# Patient Record
Sex: Female | Born: 1995 | Race: Black or African American | Hispanic: No | Marital: Single | Smoking: Current every day smoker
Health system: Southern US, Community
[De-identification: ages and names within clinical notes are randomized; demographics above are authoritative.]

---
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
## 2017-03-12 ENCOUNTER — Other Ambulatory Visit: Payer: Self-pay

## 2017-03-12 ENCOUNTER — Encounter (HOSPITAL_COMMUNITY): Payer: Self-pay | Admitting: Emergency Medicine

## 2017-03-12 DIAGNOSIS — Z5321 Procedure and treatment not carried out due to patient leaving prior to being seen by health care provider: Secondary | ICD-10-CM | POA: Insufficient documentation

## 2017-03-12 NOTE — ED Triage Notes (Signed)
Patient reports low back pain onset yesterday , denies injury , pt. stated pain occurs when she is on her period , pt. added heavy lifting at work . No dysuria or hematuria .

## 2017-03-13 ENCOUNTER — Emergency Department (HOSPITAL_COMMUNITY)
Admission: EM | Admit: 2017-03-13 | Discharge: 2017-03-13 | Disposition: A | Discharge status: Home / Self Care | Payer: Self-pay | Attending: Emergency Medicine | Admitting: Emergency Medicine

## 2017-03-13 NOTE — ED Notes (Signed)
Pt LWBS 

## 2017-07-30 ENCOUNTER — Encounter (HOSPITAL_COMMUNITY): Payer: Self-pay | Admitting: Emergency Medicine

## 2017-07-30 ENCOUNTER — Other Ambulatory Visit: Payer: Self-pay

## 2017-07-30 ENCOUNTER — Emergency Department (HOSPITAL_COMMUNITY): Payer: Self-pay

## 2017-07-30 ENCOUNTER — Emergency Department (HOSPITAL_COMMUNITY)
Admission: EM | Admit: 2017-07-30 | Discharge: 2017-07-30 | Disposition: A | Discharge status: Home / Self Care | Payer: Self-pay | Attending: Emergency Medicine | Admitting: Emergency Medicine

## 2017-07-30 DIAGNOSIS — Z7982 Long term (current) use of aspirin: Secondary | ICD-10-CM | POA: Insufficient documentation

## 2017-07-30 DIAGNOSIS — S61411A Laceration without foreign body of right hand, initial encounter: Secondary | ICD-10-CM | POA: Insufficient documentation

## 2017-07-30 DIAGNOSIS — W260XXA Contact with knife, initial encounter: Secondary | ICD-10-CM | POA: Insufficient documentation

## 2017-07-30 DIAGNOSIS — Y93G3 Activity, cooking and baking: Secondary | ICD-10-CM | POA: Insufficient documentation

## 2017-07-30 DIAGNOSIS — Y999 Unspecified external cause status: Secondary | ICD-10-CM | POA: Insufficient documentation

## 2017-07-30 DIAGNOSIS — F17210 Nicotine dependence, cigarettes, uncomplicated: Secondary | ICD-10-CM | POA: Insufficient documentation

## 2017-07-30 DIAGNOSIS — Y929 Unspecified place or not applicable: Secondary | ICD-10-CM | POA: Insufficient documentation

## 2017-07-30 DIAGNOSIS — Z79899 Other long term (current) drug therapy: Secondary | ICD-10-CM | POA: Insufficient documentation

## 2017-07-30 MED ORDER — LIDOCAINE-EPINEPHRINE (PF) 2 %-1:200000 IJ SOLN
10.0000 mL | Freq: Once | INTRAMUSCULAR | Status: AC
  Administered 2017-07-30: 10 mL via INTRADERMAL
  Filled 2017-07-30: qty 20

## 2017-07-30 MED ORDER — TETANUS-DIPHTH-ACELL PERTUSSIS 5-2.5-18.5 LF-MCG/0.5 IM SUSP
0.5000 mL | Freq: Once | INTRAMUSCULAR | Status: AC
  Administered 2017-07-30: 0.5 mL via INTRAMUSCULAR
  Filled 2017-07-30: qty 0.5

## 2017-07-30 MED ORDER — IBUPROFEN 800 MG PO TABS: 800.0000 mg | ORAL_TABLET | Freq: Three times a day (TID) | ORAL | 0 refills | Status: DC | PRN

## 2017-07-30 NOTE — Discharge Instructions (Addendum)
Please have your sutures remove in 5 days.  Take ibuprofen as needed for pain.  Monitor for sign of infection.

## 2017-07-30 NOTE — ED Notes (Signed)
ED Provider at bedside. 

## 2017-07-30 NOTE — ED Notes (Signed)
Pt states right hand was cut on knife that she also used for raw chicken.

## 2017-07-30 NOTE — ED Triage Notes (Signed)
C/o approx 1-2 cm laceration to posterior R hand  (near 4th and 5th digits) from a kitchen knife while cooking 30 min ago.  Bleeding controlled pta.
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
## 2017-07-30 NOTE — ED Provider Notes (Signed)
[facility] EMERGENCY DEPARTMENT Provider Note   CSN: [id number] Arrival date & time: 07/30/17  2056     History   Chief Complaint Chief Complaint  Patient presents with  . Extremity Laceration    HPI Tiffany Stephens is a 22 y.o. female.  HPI   22 year old female presenting for evaluation of right hand injury.  Patient reports a few hours ago she was cooking, while cutting raw chicken she accidentally stabbed her right dominant hand with a knife.  She report acute onset of sharp pain to the affected area.  Pain is rated as 6 out of 10, nonradiating without any associated numbness.  No specific treatment tried.  She is here for management.  She cannot recall last tetanus status.  History reviewed. No pertinent past medical history.  Patient Active Problem List   Diagnosis Date Noted  . Menorrhagia 07/29/2012    History reviewed. No pertinent surgical history.   OB History   None      Home Medications    Prior to Admission medications   Medication Sig Start Date End Date Taking? Authorizing Provider  aspirin 325 MG tablet Take 325 mg by mouth every 4 (four) hours as needed for pain.    [provider]  ibuprofen (ADVIL,MOTRIN) 800 MG tablet Take 1 tablet (800 mg total) by mouth 3 (three) times daily. 11/03/12   Garlon Hatchet, PA-C    Family History Family History  Problem Relation Age of Onset  . Diabetes Mother   . Heart disease Mother     Social History Social History   Tobacco Use  . Smoking status: Current Every Day Smoker  . Smokeless tobacco: Never Used  Substance Use Topics  . Alcohol use: Yes  . Drug use: No     Allergies   Patient has no known allergies.   Review of Systems Review of Systems  Skin: Positive for wound.  Neurological: Negative for numbness.     Physical Exam Updated Vital Signs BP 138/84 (BP Location: Right Arm)   Pulse 83   Temp 98 F (36.7 C) (Oral)   Resp 16   LMP 07/25/2017   SpO2  100%   Physical Exam  Constitutional: She appears well-developed and well-nourished. No distress.  HENT:  Head: Atraumatic.  Eyes: Conjunctivae are normal.  Neck: Neck supple.  Musculoskeletal: She exhibits tenderness (Right hand: There is a puncture wound approximately 0.5 cm in diameter noted to the dorsum of the right hand in between fourth and fifth metacarpal region without any foreign body noted.  It is tender to palpation with surrounding edema but no ecchymosis. ).  Neurological: She is alert.  Skin: No rash noted.  Psychiatric: She has a normal mood and affect.  Nursing note and vitals reviewed.    ED Treatments / Results  Labs (all labs ordered are listed, but only abnormal results are displayed) Labs Reviewed - No data to display  EKG None  Radiology Dg Hand Complete Right  Result Date: 07/30/2017 CLINICAL DATA:  Laceration to the right posterior hand between the fourth and fifth metacarpal phalangeal joints. EXAM: RIGHT HAND - COMPLETE 3+ VIEW COMPARISON:  None. FINDINGS: Soft tissue swelling over the dorsum of the hand at the level of the MCP joints. Reported laceration is radiographically occult. No radiopaque foreign body nor acute osseous abnormality. Joint spaces are maintained. Carpal rows are intact. Distal radius and ulna are nonacute. IMPRESSION: Soft tissue swelling over the dorsum of the hand. No  radiopaque foreign body nor acute osseous abnormality. Electronically Signed   By: Tollie Eth M.D.   On: 07/30/2017 22:34    Procedures .Marland KitchenLaceration Repair Date/Time: 07/30/2017 11:18 PM Performed by: Fayrene Helper, PA-C Authorized by: Fayrene Helper, PA-C   Consent:    Consent obtained:  Verbal   Consent given by:  Patient   Risks discussed:  Infection   Alternatives discussed:  No treatment Anesthesia (see MAR for exact dosages):    Anesthesia method:  Local infiltration   Local anesthetic:  Lidocaine 2% WITH epi Laceration details:    Location:  Hand   Hand  location:  R hand, dorsum   Length (cm):  1   Depth (mm):  5 Repair type:    Repair type:  Simple Pre-procedure details:    Preparation:  Patient was prepped and draped in usual sterile fashion and imaging obtained to evaluate for foreign bodies Exploration:    Hemostasis achieved with:  Direct pressure   Wound exploration: wound explored through full range of motion and entire depth of wound probed and visualized     Wound extent: no muscle damage noted, no nerve damage noted and no underlying fracture noted     Contaminated: no   Treatment:    Area cleansed with:  Saline   Amount of cleaning:  Standard   Irrigation solution:  Sterile saline   Irrigation volume:  100   Irrigation method:  Pressure wash   Visualized foreign bodies/material removed: no   Skin repair:    Repair method:  Sutures   Suture size:  5-0   Suture material:  Prolene   Suture technique:  Simple interrupted   Number of sutures:  3 Approximation:    Approximation:  Close Post-procedure details:    Dressing:  Non-adherent dressing   Patient tolerance of procedure:  Tolerated well, no immediate complications   (including critical care time)  Medications Ordered in ED Medications  lidocaine-EPINEPHrine (XYLOCAINE W/EPI) 2 %-1:200000 (PF) injection 10 mL (10 mLs Intradermal Given by Other 07/30/17 2257)  Tdap (BOOSTRIX) injection 0.5 mL (0.5 mLs Intramuscular Given 07/30/17 2257)     Initial Impression / Assessment and Plan / ED Course  I have reviewed the triage vital signs and the nursing notes.  Pertinent labs & imaging results that were available during my care of the patient were reviewed by me and considered in my medical decision making (see chart for details).     BP 138/84 (BP Location: Right Arm)   Pulse 83   Temp 98 F (36.7 C) (Oral)   Resp 16   LMP 07/25/2017   SpO2 100%    Final Clinical Impressions(s) / ED Diagnoses   Final diagnoses:  Laceration of right hand without foreign  body, initial encounter    ED Discharge Orders        Ordered    ibuprofen (ADVIL,MOTRIN) 800 MG tablet  Every 8 hours PRN     07/30/17 2322     10:53 PM Patient accepting staff is soft in her right dominant hand.  She has a puncture wound noted to the dorsum of her right hand.  It is not actively bleeding.  We will update her tetanus, will perform a thorough wound irrigation and suture repair.  An x-ray was obtained demonstrating soft tissue swelling over the dorsum of the hand without any radiopaque foreign body or acute osseous abnormality.  11:21 PM Wound were cleansed.  Sutures in place.  Recommend sutures removal in 5 days.  Wound care instruction given, return precaution discussed.    Fayrene Helper, PA-C 07/30/17 2324    Pricilla Loveless, MD 07/30/17 [phone]

## 2017-10-07 ENCOUNTER — Encounter (HOSPITAL_COMMUNITY): Payer: Self-pay

## 2017-10-07 ENCOUNTER — Emergency Department (HOSPITAL_COMMUNITY)
Admission: EM | Admit: 2017-10-07 | Discharge: 2017-10-07 | Disposition: A | Discharge status: Home / Self Care | Payer: Self-pay | Attending: Emergency Medicine | Admitting: Emergency Medicine

## 2017-10-07 DIAGNOSIS — F17200 Nicotine dependence, unspecified, uncomplicated: Secondary | ICD-10-CM | POA: Insufficient documentation

## 2017-10-07 DIAGNOSIS — J029 Acute pharyngitis, unspecified: Secondary | ICD-10-CM | POA: Insufficient documentation

## 2017-10-07 DIAGNOSIS — Z7982 Long term (current) use of aspirin: Secondary | ICD-10-CM | POA: Insufficient documentation

## 2017-10-07 DIAGNOSIS — Z202 Contact with and (suspected) exposure to infections with a predominantly sexual mode of transmission: Secondary | ICD-10-CM | POA: Insufficient documentation

## 2017-10-07 LAB — GROUP A STREP BY PCR: Group A Strep by PCR: NOT DETECTED

## 2017-10-07 MED ORDER — STERILE WATER FOR INJECTION IJ SOLN
INTRAMUSCULAR | Status: AC
  Administered 2017-10-07: 10 mL
  Filled 2017-10-07: qty 10

## 2017-10-07 MED ORDER — IBUPROFEN 800 MG PO TABS: 800.0000 mg | ORAL_TABLET | Freq: Three times a day (TID) | ORAL | 0 refills | Status: DC | PRN

## 2017-10-07 MED ORDER — IBUPROFEN 800 MG PO TABS
800.0000 mg | ORAL_TABLET | Freq: Once | ORAL | Status: AC
  Administered 2017-10-07: 800 mg via ORAL
  Filled 2017-10-07: qty 1

## 2017-10-07 MED ORDER — CEFTRIAXONE SODIUM 250 MG IJ SOLR
250.0000 mg | Freq: Once | INTRAMUSCULAR | Status: AC
  Administered 2017-10-07: 250 mg via INTRAMUSCULAR
  Filled 2017-10-07: qty 250

## 2017-10-07 MED ORDER — AZITHROMYCIN 250 MG PO TABS
1000.0000 mg | ORAL_TABLET | Freq: Once | ORAL | Status: AC
  Administered 2017-10-07: 1000 mg via ORAL
  Filled 2017-10-07: qty 4

## 2017-10-07 NOTE — Discharge Instructions (Addendum)
You were seen in the ER for a sore throat. Your strep test was negative. We discussed that it is possible that an STD caused this, but we are unsure at this time, your tests for gonorrhea/chlamydia are pending, we will call you if these results are positive, if positive you will need to informal all sexual partners. We have treated you for these conditions in the ER . We are also prescribing you 800 mg ibuprofen, take this every 8 hours with foods as needed for pain. This can cause stomach upset and at worst stomach bleeding. Do not take other NSAIDs with this such as advil, motrin, naproxen, mobic, or aleve as these are similar. You may supplement with tylenol per over the counter dosing.    Follow up with your primary care provider within 5 days for re-evaluation. Return to the ER for new or worsening symptoms including but not limited to fever, inability to swallow/move your neck/open your mouth, or change in your voice, or any other concerns.

## 2017-10-07 NOTE — ED Notes (Signed)
Declined W/C at D/C and was escorted to lobby by RN. 
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
## 2017-10-07 NOTE — ED Triage Notes (Signed)
Pt presents with 2 day h/o difficulty swallowing.  Pt denies any cold symptoms, denies any fever.

## 2017-10-08 LAB — GC/CHLAMYDIA PROBE AMP (~~LOC~~) NOT AT ARMC
Chlamydia: NEGATIVE
Neisseria Gonorrhea: NEGATIVE

## 2018-02-04 ENCOUNTER — Emergency Department (HOSPITAL_COMMUNITY)
Admission: EM | Admit: 2018-02-04 | Discharge: 2018-02-04 | Disposition: A | Discharge status: Home / Self Care | Payer: Self-pay | Attending: Emergency Medicine | Admitting: Emergency Medicine

## 2018-02-04 ENCOUNTER — Encounter (HOSPITAL_COMMUNITY): Payer: Self-pay | Admitting: Emergency Medicine

## 2018-02-04 ENCOUNTER — Other Ambulatory Visit: Payer: Self-pay

## 2018-02-04 DIAGNOSIS — Z7982 Long term (current) use of aspirin: Secondary | ICD-10-CM | POA: Insufficient documentation

## 2018-02-04 DIAGNOSIS — K0889 Other specified disorders of teeth and supporting structures: Secondary | ICD-10-CM | POA: Insufficient documentation

## 2018-02-04 DIAGNOSIS — K029 Dental caries, unspecified: Secondary | ICD-10-CM | POA: Insufficient documentation

## 2018-02-04 DIAGNOSIS — F17210 Nicotine dependence, cigarettes, uncomplicated: Secondary | ICD-10-CM | POA: Insufficient documentation

## 2018-02-04 MED ORDER — OXYCODONE-ACETAMINOPHEN 5-325 MG PO TABS
2.0000 | ORAL_TABLET | Freq: Once | ORAL | Status: AC
  Administered 2018-02-04: 2 via ORAL
  Filled 2018-02-04: qty 2

## 2018-02-04 MED ORDER — BUPIVACAINE-EPINEPHRINE (PF) 0.25% -1:200000 IJ SOLN: 1.7000 mL | Freq: Once | INTRAMUSCULAR | Status: DC

## 2018-02-04 MED ORDER — AMOXICILLIN 500 MG PO CAPS: 1000.0000 mg | ORAL_CAPSULE | Freq: Two times a day (BID) | ORAL | 0 refills | Status: AC

## 2018-02-04 MED ORDER — IBUPROFEN 600 MG PO TABS: 600.0000 mg | ORAL_TABLET | Freq: Three times a day (TID) | ORAL | 0 refills | Status: DC | PRN

## 2018-02-04 MED ORDER — BUPIVACAINE-EPINEPHRINE (PF) 0.5% -1:200000 IJ SOLN
1.8000 mL | Freq: Once | INTRAMUSCULAR | Status: AC
  Administered 2018-02-04: 1.8 mL

## 2018-02-04 MED ORDER — HYDROCODONE-ACETAMINOPHEN 5-325 MG PO TABS: 1.0000 | ORAL_TABLET | Freq: Four times a day (QID) | ORAL | 0 refills | Status: DC | PRN

## 2018-02-04 NOTE — ED Triage Notes (Signed)
Pt reports a swollen left lower & upper jaw with a headache and ear pain. Pt states this all started yesterday, 11/21. Pt does have a broken tooth on the left rear of her mouth.

## 2018-02-04 NOTE — ED Provider Notes (Addendum)
[facility] EMERGENCY DEPARTMENT Provider Note   CSN: [id number] Arrival date & time: 02/04/18  0101     History   Chief Complaint Chief Complaint  Patient presents with  . Dental Pain    HPI Tiffany Stephens is a 22 y.o. female.  HPI 22 year old female history of poor dentition here with left dental pain.  Patient states that she fractured her left lower premolar several months ago.  Since then, she said intermittent pain.  She has been having pain with chewing on that side since then.  However, over the last 24 hours, she developed aggressive worsening aching, throbbing, severe, 10 out of 10 pain in the left lower jaw.  The pain radiates towards her ear and lower neck.  She denies any tongue swelling or difficulty swallowing.  No fevers chills.  No facial redness or swelling.  Pain is worse with palpation and eating.  No alleviating factors.  History reviewed. No pertinent past medical history.  Patient Active Problem List   Diagnosis Date Noted  . Menorrhagia 07/29/2012    History reviewed. No pertinent surgical history.   OB History   None      Home Medications    Prior to Admission medications   Medication Sig Start Date End Date Taking? Authorizing Provider  amoxicillin (AMOXIL) 500 MG capsule Take 2 capsules (1,000 mg total) by mouth 2 (two) times daily for 7 days. 02/04/18 02/11/18  Shaune Pollack, MD  aspirin 325 MG tablet Take 325 mg by mouth every 4 (four) hours as needed for pain.    [provider]  HYDROcodone-acetaminophen (NORCO/VICODIN) 5-325 MG tablet Take 1-2 tablets by mouth every 6 (six) hours as needed for moderate pain or severe pain. 02/04/18   Shaune Pollack, MD  ibuprofen (ADVIL,MOTRIN) 600 MG tablet Take 1 tablet (600 mg total) by mouth every 8 (eight) hours as needed for moderate pain. 02/04/18   Shaune Pollack, MD    Family History Family History  Problem Relation Age of Onset  . Diabetes Mother   . Heart  disease Mother     Social History Social History   Tobacco Use  . Smoking status: Current Every Day Smoker  . Smokeless tobacco: Never Used  Substance Use Topics  . Alcohol use: Yes  . Drug use: No     Allergies   Patient has no known allergies.   Review of Systems Review of Systems  Constitutional: Negative for chills, fatigue and fever.  HENT: Positive for dental problem and ear pain. Negative for congestion and rhinorrhea.   Eyes: Negative for visual disturbance.  Respiratory: Negative for cough, shortness of breath and wheezing.   Cardiovascular: Negative for chest pain and leg swelling.  Gastrointestinal: Negative for abdominal pain, diarrhea, nausea and vomiting.  Genitourinary: Negative for dysuria and flank pain.  Musculoskeletal: Negative for neck pain and neck stiffness.  Skin: Negative for rash and wound.  Allergic/Immunologic: Negative for immunocompromised state.  Neurological: Negative for syncope, weakness and headaches.  All other systems reviewed and are negative.    Physical Exam Updated Vital Signs BP (!) 175/100 (BP Location: Right Arm)   Pulse 63   Temp 98.2 F (36.8 C) (Oral)   Resp 16   Ht 5\' 6"  (1.676 m)   Wt 127 kg   LMP 01/14/2018 (Exact Date)   SpO2 100%   BMI 45.19 kg/m   Physical Exam  Constitutional: She is oriented to person, place, and time. She appears well-developed and well-nourished. No  distress.  HENT:  Head: Normocephalic and atraumatic.  Multiple active caries bilateral premolars of the mandible.  The left mandibular bicuspid has fracture with exposed pulp and surrounding mild gingival edema.  No appreciable abscess.  Exquisite tenderness to palpation.  No sublingual swelling or induration.  Posterior oropharynx patent and clear.  Eyes: Conjunctivae are normal.  Neck: Neck supple.  Cardiovascular: Normal rate, regular rhythm and normal heart sounds. Exam reveals no friction rub.  No murmur heard. Pulmonary/Chest: Effort  normal and breath sounds normal. No respiratory distress. She has no wheezes. She has no rales.  Abdominal: She exhibits no distension.  Musculoskeletal: She exhibits no edema.  Neurological: She is alert and oriented to person, place, and time. She exhibits normal muscle tone.  Skin: Skin is warm. Capillary refill takes less than 2 seconds.  Psychiatric: She has a normal mood and affect.  Nursing note and vitals reviewed.    ED Treatments / Results  Labs (all labs ordered are listed, but only abnormal results are displayed) Labs Reviewed - No data to display  EKG None  Radiology No results found.  Procedures Dental Block Date/Time: 02/04/2018 2:07 AM Performed by: Shaune PollackIsaacs, Lejla Moeser, MD Authorized by: Shaune PollackIsaacs, Manaal Mandala, MD   Consent:    Consent obtained:  Verbal   Consent given by:  Patient   Risks discussed:  Allergic reaction, hematoma, intravascular injection, nerve damage, pain, unsuccessful block, swelling and infection   Alternatives discussed:  Alternative treatment Indications:    Indications: dental pain   Location:    Block type:  Supraperiosteal   Supraperiosteal location:  Lower teeth   Lower teeth location:  20/LL 2nd bicuspid and 19/LL 1st molar Procedure details (see MAR for exact dosages):    Topical anesthetic:  Lidocaine gel   Syringe type:  Aspirating dental syringe   Needle gauge:  27 G   Anesthetic injected:  Bupivacaine 0.25% WITH epi   Injection procedure:  Anatomic landmarks identified, anatomic landmarks palpated, introduced needle, negative aspiration for blood and incremental injection Post-procedure details:    Outcome:  Anesthesia achieved   Patient tolerance of procedure:  Tolerated well, no immediate complications   (including critical care time)  Medications Ordered in ED Medications  oxyCODONE-acetaminophen (PERCOCET/ROXICET) 5-325 MG per tablet 2 tablet (2 tablets Oral Given 02/04/18 0135)  bupivacaine-epinephrine (MARCAINE W/ EPI)  0.5% -1:200000 injection 1.8 mL (1.8 mLs Infiltration Given 02/04/18 0148)     Initial Impression / Assessment and Plan / ED Course  I have reviewed the triage vital signs and the nursing notes.  Pertinent labs & imaging results that were available during my care of the patient were reviewed by me and considered in my medical decision making (see chart for details).     22 year old female here with left 1st molar and bicuspid dental caries and possible early periapical abscess versus pulpitis.  Discussed pain management options and dental block performed with informed consent.  Patient had complete anesthesia.  Will discharge with outpatient course of antibiotics and dentistry referral.  No signs of deep or facial abscess.  No evidence of Ludwick's or systemic infection.  Final Clinical Impressions(s) / ED Diagnoses   Final diagnoses:  Pain, dental  Dental caries    ED Discharge Orders         Ordered    amoxicillin (AMOXIL) 500 MG capsule  2 times daily     02/04/18 0205    ibuprofen (ADVIL,MOTRIN) 600 MG tablet  Every 8 hours PRN  02/04/18 0205    HYDROcodone-acetaminophen (NORCO/VICODIN) 5-325 MG tablet  Every 6 hours PRN     02/04/18 0205           Shaune Pollack, MD 02/04/18 Joan Flores    Shaune Pollack, MD 02/04/18 [phone]

## 2018-02-06 ENCOUNTER — Other Ambulatory Visit: Payer: Self-pay

## 2018-02-06 ENCOUNTER — Encounter (HOSPITAL_COMMUNITY): Payer: Self-pay | Admitting: *Deleted

## 2018-02-06 ENCOUNTER — Emergency Department (HOSPITAL_COMMUNITY)
Admission: EM | Admit: 2018-02-06 | Discharge: 2018-02-06 | Disposition: A | Discharge status: Home / Self Care | Payer: Self-pay | Attending: Emergency Medicine | Admitting: Emergency Medicine

## 2018-02-06 DIAGNOSIS — K0889 Other specified disorders of teeth and supporting structures: Secondary | ICD-10-CM | POA: Insufficient documentation

## 2018-02-06 DIAGNOSIS — F17200 Nicotine dependence, unspecified, uncomplicated: Secondary | ICD-10-CM | POA: Insufficient documentation

## 2018-02-06 MED ORDER — HYDROCODONE-ACETAMINOPHEN 5-325 MG PO TABS
1.0000 | ORAL_TABLET | Freq: Once | ORAL | Status: AC
  Administered 2018-02-06: 1 via ORAL
  Filled 2018-02-06: qty 1

## 2018-02-06 NOTE — ED Provider Notes (Signed)
[facility]   CSN: [id number] Arrival date & time: 02/06/18  2119     History   Chief Complaint Chief Complaint  Patient presents with  . Dental Pain    HPI Tiffany Stephens is a 22 y.o. female with no major medical history presents to the Emergency Department complaining of gradual, persistent, progressively worsening left lower dental pain onset 3 days ago.  Pt reports she was seen in the ED several days ago and given abx, hydrocodone and ibuprofen.  She reports the pain has been persistent, worsened by eating.  Nothing makes it better. She has not yet followed up with the dentist.  Pt denies swelling, difficulty swallowing, changes in  Voice, fever, vomiting.     The history is provided by the patient, a significant other and medical records. No language interpreter was used.    History reviewed. No pertinent past medical history.  Patient Active Problem List   Diagnosis Date Noted  . Menorrhagia 07/29/2012    History reviewed. No pertinent surgical history.   OB History   None      Home Medications    Prior to Admission medications   Medication Sig Start Date End Date Taking? Authorizing Provider  amoxicillin (AMOXIL) 500 MG capsule Take 2 capsules (1,000 mg total) by mouth 2 (two) times daily for 7 days. 02/04/18 02/11/18  Shaune PollackIsaacs, Cameron, MD  aspirin 325 MG tablet Take 325 mg by mouth every 4 (four) hours as needed for pain.    [provider]  HYDROcodone-acetaminophen (NORCO/VICODIN) 5-325 MG tablet Take 1-2 tablets by mouth every 6 (six) hours as needed for moderate pain or severe pain. 02/04/18   Shaune PollackIsaacs, Cameron, MD  ibuprofen (ADVIL,MOTRIN) 600 MG tablet Take 1 tablet (600 mg total) by mouth every 8 (eight) hours as needed for moderate pain. 02/04/18   Shaune PollackIsaacs, Cameron, MD    Family History Family History  Problem Relation Age of Onset  . Diabetes Mother   . Heart disease Mother     Social  History Social History   Tobacco Use  . Smoking status: Current Every Day Smoker  . Smokeless tobacco: Never Used  Substance Use Topics  . Alcohol use: Yes  . Drug use: No     Allergies   Patient has no known allergies.   Review of Systems Review of Systems  Constitutional: Negative for fever.  HENT: Positive for dental problem.   Respiratory: Negative for chest tightness.   Cardiovascular: Negative for chest pain.  Gastrointestinal: Negative for abdominal pain, nausea and vomiting.  Skin: Negative for color change.  Allergic/Immunologic: Negative for immunocompromised state.  Neurological: Negative for headaches.  Hematological: Negative for adenopathy.     Physical Exam Updated Vital Signs BP 126/73 (BP Location: Right Arm)   Pulse 68   Temp 98.4 F (36.9 C) (Oral)   Resp 18   LMP 01/14/2018 (Exact Date)   SpO2 100%   Physical Exam  Constitutional: She appears well-developed and well-nourished.  HENT:  Head: Normocephalic.  Right Ear: Tympanic membrane, external ear and ear canal normal.  Left Ear: Tympanic membrane, external ear and ear canal normal.  Nose: Nose normal. Right sinus exhibits no maxillary sinus tenderness and no frontal sinus tenderness. Left sinus exhibits no maxillary sinus tenderness and no frontal sinus tenderness.  Mouth/Throat: Uvula is midline, oropharynx is clear and moist and mucous membranes are normal. No oral lesions. Abnormal dentition. Dental caries present. No uvula swelling or lacerations.  No oropharyngeal exudate, posterior oropharyngeal edema, posterior oropharyngeal erythema or tonsillar abscesses.  Left mandibular bicuspid with fracture and exposed pulp, no erythema or edema of the gingiva Posterior oropharynx patent and clear.  No gross abscess No fluctuance or induration to the buccal mucosa or sublingual space  Eyes: Conjunctivae are normal. Right eye exhibits no discharge. Left eye exhibits no discharge.  Neck: Normal  range of motion. Neck supple.  No stridor Handling secretions without difficulty No nuchal rigidity No cervical lymphadenopathy  Cardiovascular: Normal rate and regular rhythm.  Pulmonary/Chest: Effort normal. No respiratory distress.  Equal chest rise  Lymphadenopathy:    She has no cervical adenopathy.  Neurological: She is alert.  Skin: Skin is warm and dry.  Psychiatric: She has a normal mood and affect.  Nursing note and vitals reviewed.    ED Treatments / Results   Procedures Procedures (including critical care time)  Medications Ordered in ED Medications  HYDROcodone-acetaminophen (NORCO/VICODIN) 5-325 MG per tablet 1 tablet (1 tablet Oral Given 02/06/18 2234)     Initial Impression / Assessment and Plan / ED Course  I have reviewed the triage vital signs and the nursing notes.  Pertinent labs & imaging results that were available during my care of the patient were reviewed by me and considered in my medical decision making (see chart for details).     Patient with toothache.  No gross abscess.  Exam unconcerning for Ludwig's angina or spread of infection.  Record reviewed from last visit on 02/04/2018.  Today's exam appears improved with resolution of erythema and edema of the gingiva.  Patient continues to take her antibiotic, hydrocodone and ibuprofen.  I have offered repeat dental block today however patient declines.  Additional conservative therapy as discussed.  Urged patient to follow-up with dentist and discussed reasons to return immediately to the emergency department.   Final Clinical Impressions(s) / ED Diagnoses   Final diagnoses:  Pain, dental    ED Discharge Orders    None       Kleigh Hoelzer, Boyd Kerbs 02/06/18 2234    Rolan Bucco, MD 02/06/18 2235

## 2018-02-06 NOTE — Discharge Instructions (Addendum)
1. Medications: usual home medications including those prescribed at the last visit 2. Treatment: rest, drink plenty of fluids, take medications as prescribed 3. Follow Up: Please followup with dentistry within 1 week for discussion of your diagnoses and further evaluation after today's visit; if you do not have a primary care doctor use the resource guide provided to find one; Return to the ER for high fevers, difficulty breathing, difficulty swallowing or other concerning symptoms

## 2018-02-06 NOTE — ED Triage Notes (Signed)
Pt reports left side dental pain. Was seen on 11/22 for same. Airway intact.

## 2018-02-08 ENCOUNTER — Other Ambulatory Visit: Payer: Self-pay

## 2018-02-08 ENCOUNTER — Encounter (HOSPITAL_COMMUNITY): Payer: Self-pay

## 2018-02-08 ENCOUNTER — Emergency Department (HOSPITAL_COMMUNITY)
Admission: EM | Admit: 2018-02-08 | Discharge: 2018-02-08 | Disposition: A | Discharge status: Home / Self Care | Payer: Self-pay | Attending: Emergency Medicine | Admitting: Emergency Medicine

## 2018-02-08 DIAGNOSIS — F17200 Nicotine dependence, unspecified, uncomplicated: Secondary | ICD-10-CM | POA: Insufficient documentation

## 2018-02-08 DIAGNOSIS — K0889 Other specified disorders of teeth and supporting structures: Secondary | ICD-10-CM | POA: Insufficient documentation

## 2018-02-08 MED ORDER — HYDROCODONE-ACETAMINOPHEN 5-325 MG PO TABS
1.0000 | ORAL_TABLET | Freq: Once | ORAL | Status: AC
  Administered 2018-02-08: 1 via ORAL
  Filled 2018-02-08: qty 1

## 2018-02-08 NOTE — ED Triage Notes (Signed)
Pt here for left side dental pain. Seen here 11/24 for same. Minimal swelling noted. Pt tearful.

## 2018-02-08 NOTE — ED Provider Notes (Signed)
[facility] EMERGENCY DEPARTMENT Provider Note   CSN: [id number] Arrival date & time: 02/08/18  0708     History   Chief Complaint Chief Complaint  Patient presents with  . Dental Pain    HPI Tiffany Stephens is a 22 y.o. female.  22 y/o female with no PMH presents to the ED with a chief complaint of dental pain x 1 week. Patient was seen in the ED twice for the same complaint, on 11/22 she was prescribed antibiotics, ibuprofen and hydrocodone. Patient reports the pain medication isn't helping and she has not followed up with a dentist or has any upcoming scheduled appointment. Sister at the bedside is requesting patient to be given something stronger for pain and "dont you guys transfer to the dentist". I have advised patient and family member that she needs to follow up with the dentist as soon as possible. Patient denies any fever, tolerating secretions or didficulty swallowing.      History reviewed. No pertinent past medical history.  Patient Active Problem List   Diagnosis Date Noted  . Menorrhagia 07/29/2012    History reviewed. No pertinent surgical history.   OB History   None      Home Medications    Prior to Admission medications   Medication Sig Start Date End Date Taking? Authorizing Provider  amoxicillin (AMOXIL) 500 MG capsule Take 2 capsules (1,000 mg total) by mouth 2 (two) times daily for 7 days. 02/04/18 02/11/18  Shaune Pollack, MD  aspirin 325 MG tablet Take 325 mg by mouth every 4 (four) hours as needed for pain.    [provider]  HYDROcodone-acetaminophen (NORCO/VICODIN) 5-325 MG tablet Take 1-2 tablets by mouth every 6 (six) hours as needed for moderate pain or severe pain. 02/04/18   Shaune Pollack, MD  ibuprofen (ADVIL,MOTRIN) 600 MG tablet Take 1 tablet (600 mg total) by mouth every 8 (eight) hours as needed for moderate pain. 02/04/18   Shaune Pollack, MD    Family History Family History  Problem Relation  Age of Onset  . Diabetes Mother   . Heart disease Mother     Social History Social History   Tobacco Use  . Smoking status: Current Every Day Smoker  . Smokeless tobacco: Never Used  Substance Use Topics  . Alcohol use: Yes  . Drug use: No     Allergies   Patient has no known allergies.   Review of Systems Review of Systems  Constitutional: Negative for fever.  HENT: Positive for dental problem. Negative for trouble swallowing.      Physical Exam Updated Vital Signs BP (!) 160/111 (BP Location: Right Arm)   Pulse 64   Temp 98.3 F (36.8 C) (Oral)   Resp 18   LMP 01/14/2018 (Exact Date)   SpO2 100%   Physical Exam  Constitutional: She is oriented to person, place, and time. She appears well-developed and well-nourished.  Tearful on exam  HENT:  Head: Normocephalic and atraumatic.  Mouth/Throat: Oropharynx is clear and moist. No trismus in the jaw. No dental abscesses. No posterior oropharyngeal edema or posterior oropharyngeal erythema.    Left mandibular bicuspid with fracture and exposed pulp, no erythema or edema.No surrounding abscess or abscess present. No facial swelling noted.    Neck: Normal range of motion. Neck supple.  Cardiovascular: Normal heart sounds.  Pulmonary/Chest: Effort normal.  Abdominal: Soft.  Neurological: She is alert and oriented to person, place, and time.  Skin: Skin is warm and dry.  ED Treatments / Results  Labs (all labs ordered are listed, but only abnormal results are displayed) Labs Reviewed - No data to display  EKG None  Radiology No results found.  Procedures Procedures (including critical care time)  Medications Ordered in ED Medications  HYDROcodone-acetaminophen (NORCO/VICODIN) 5-325 MG per tablet 1 tablet (1 tablet Oral Given 02/08/18 0726)     Initial Impression / Assessment and Plan / ED Course  I have reviewed the triage vital signs and the nursing notes.  Pertinent labs & imaging results  that were available during my care of the patient were reviewed by me and considered in my medical decision making (see chart for details).    Patient present with dental pain, this is patient's third visit this week for dental pain.She was prescribed antibiotics, ibuprofen and hydrocodone on 11/22, reports she has not completed the antibiotic course and has now run out of hydrocodone.  Patient also states she has not made an appointment with a dentist that has followed up with any dental resources.  Sister at the bedside is wondering if we transfer people for this issue and states that her sister is in a lot of pain.  Advised patient that unfortunately we have no dental resources in the hospital and I am unable to provide this for her.  She was giving a prescription for hydrocodone and I have given her a Norco while in the ED but I have stated that I cannot fill the prescription for hydrocodone as she ultimately needs to see a dentist.  7:45 AM explained to patient that ultimately she needs to get dental repair, patient reports she understands that she will try to schedule this appointment, she mention a dental block but then stated that she does not want this therapy done either.  Have advised patient needs to complete the antibiotic therapy along with follow-up with the dentist.  Patient understands and agrees with plan.  Return precautions provided.  Final Clinical Impressions(s) / ED Diagnoses   Final diagnoses:  Pain, dental    ED Discharge Orders    None       Claude MangesSoto, Abigail Marsiglia, PA-C 02/08/18 [id number]    Arby BarrettePfeiffer, Marcy, MD 02/10/18 1320

## 2018-02-08 NOTE — Discharge Instructions (Addendum)
Please schedule an appointment with a dentist as soon as possible. Please make sure to complete antibiotic therapy.

## 2018-06-23 ENCOUNTER — Emergency Department (HOSPITAL_COMMUNITY)
Admission: EM | Admit: 2018-06-23 | Discharge: 2018-06-23 | Disposition: A | Discharge status: Home / Self Care | Payer: Self-pay | Attending: Emergency Medicine | Admitting: Emergency Medicine

## 2018-06-23 ENCOUNTER — Other Ambulatory Visit: Payer: Self-pay

## 2018-06-23 DIAGNOSIS — R059 Cough, unspecified: Secondary | ICD-10-CM

## 2018-06-23 DIAGNOSIS — F17200 Nicotine dependence, unspecified, uncomplicated: Secondary | ICD-10-CM | POA: Insufficient documentation

## 2018-06-23 DIAGNOSIS — R05 Cough: Secondary | ICD-10-CM

## 2018-06-23 DIAGNOSIS — J069 Acute upper respiratory infection, unspecified: Secondary | ICD-10-CM | POA: Insufficient documentation

## 2018-06-23 NOTE — ED Provider Notes (Signed)
[facility] EMERGENCY DEPARTMENT Provider Note   CSN: [id number] Arrival date & time: 06/23/18  1619    History   Chief Complaint Chief Complaint  Patient presents with  . Fever    HPI Tiffany Stephens is a 23 y.o. female.     The history is provided by the patient and medical records. No language interpreter was used.  Cough  Cough characteristics:  Non-productive Severity:  Moderate Onset quality:  Gradual Duration:  3 days Timing:  Constant Progression:  Waxing and waning Chronicity:  New Smoker: no   Context: sick contacts   Relieved by:  Nothing Worsened by:  Nothing Ineffective treatments:  None tried Associated symptoms: chills, fever, myalgias, rhinorrhea and sinus congestion   Associated symptoms: no chest pain, no diaphoresis, no headaches, no rash, no shortness of breath, no sore throat, no weight loss and no wheezing     No past medical history on file.  Patient Active Problem List   Diagnosis Date Noted  . Menorrhagia 07/29/2012    No past surgical history on file.   OB History   No obstetric history on file.      Home Medications    Prior to Admission medications   Medication Sig Start Date End Date Taking? Authorizing Provider  aspirin 325 MG tablet Take 325 mg by mouth every 4 (four) hours as needed for pain.    [provider]  HYDROcodone-acetaminophen (NORCO/VICODIN) 5-325 MG tablet Take 1-2 tablets by mouth every 6 (six) hours as needed for moderate pain or severe pain. 02/04/18   Shaune Pollack, MD  ibuprofen (ADVIL,MOTRIN) 600 MG tablet Take 1 tablet (600 mg total) by mouth every 8 (eight) hours as needed for moderate pain. 02/04/18   Shaune Pollack, MD    Family History Family History  Problem Relation Age of Onset  . Diabetes Mother   . Heart disease Mother     Social History Social History   Tobacco Use  . Smoking status: Current Every Day Smoker  . Smokeless tobacco: Never Used  Substance  Use Topics  . Alcohol use: Yes  . Drug use: No     Allergies   Patient has no known allergies.   Review of Systems Review of Systems  Constitutional: Positive for chills and fever. Negative for diaphoresis, fatigue and weight loss.  HENT: Positive for congestion and rhinorrhea. Negative for sore throat.   Eyes: Negative for visual disturbance.  Respiratory: Positive for cough. Negative for chest tightness, shortness of breath, wheezing and stridor.   Cardiovascular: Negative for chest pain, palpitations and leg swelling.  Gastrointestinal: Negative for constipation, diarrhea and nausea.  Genitourinary: Negative for frequency.  Musculoskeletal: Positive for myalgias. Negative for back pain, neck pain and neck stiffness.  Skin: Negative for rash and wound.  Neurological: Negative for light-headedness and headaches.  Psychiatric/Behavioral: Negative for agitation.  All other systems reviewed and are negative.    Physical Exam Updated Vital Signs BP 135/84 (BP Location: Right Arm)   Pulse 86   Temp 98.3 F (36.8 C) (Oral)   Resp 17   SpO2 100%   Physical Exam Vitals signs and nursing note reviewed.  Constitutional:      General: She is not in acute distress.    Appearance: She is well-developed. She is not ill-appearing, toxic-appearing or diaphoretic.  HENT:     Head: Normocephalic and atraumatic.     Right Ear: External ear normal.     Left Ear: External ear normal.  Nose: Congestion and rhinorrhea present.     Mouth/Throat:     Pharynx: No oropharyngeal exudate or posterior oropharyngeal erythema.  Eyes:     Conjunctiva/sclera: Conjunctivae normal.     Pupils: Pupils are equal, round, and reactive to light.  Neck:     Musculoskeletal: Normal range of motion and neck supple. No muscular tenderness.  Cardiovascular:     Rate and Rhythm: Normal rate.     Pulses: Normal pulses.     Heart sounds: No murmur.  Pulmonary:     Effort: Pulmonary effort is normal. No  respiratory distress.     Breath sounds: Normal breath sounds. No stridor. No wheezing, rhonchi or rales.  Chest:     Chest wall: No tenderness.  Abdominal:     General: Abdomen is flat. There is no distension.     Tenderness: There is no abdominal tenderness. There is no rebound.  Musculoskeletal:        General: No tenderness.     Right lower leg: No edema.     Left lower leg: No edema.  Skin:    General: Skin is warm.     Findings: No erythema or rash.  Neurological:     General: No focal deficit present.     Mental Status: She is alert and oriented to person, place, and time.     Motor: No abnormal muscle tone.     Coordination: Coordination normal.     Deep Tendon Reflexes: Reflexes are normal and symmetric.      ED Treatments / Results  Labs (all labs ordered are listed, but only abnormal results are displayed) Labs Reviewed - No data to display  EKG None  Radiology No results found.  Procedures Procedures (including critical care time)  Medications Ordered in ED Medications - No data to display   [name] was evaluated in Emergency Department on 06/23/2018 for the symptoms described in the history of present illness. She was evaluated in the context of the global COVID-19 pandemic, which necessitated consideration that the patient might be at risk for infection with the SARS-CoV-2 virus that causes COVID-19. Institutional protocols and algorithms that pertain to the evaluation of patients at risk for COVID-19 are in a state of rapid change based on information released by regulatory bodies including the CDC and federal and state organizations. These policies and algorithms were followed during the patient's care in the ED.   Initial Impression / Assessment and Plan / ED Course  I have reviewed the triage vital signs and the nursing notes.  Pertinent labs & imaging results that were available during my care of the patient were reviewed by me and considered in  my medical decision making (see chart for details).        Tiffany Stephens is a 23 y.o. female with no significant past medical history who presents with several days of fever, chills, congestion, and cough.  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  Patient reports that "everybody in the family" was sick several weeks ago.  Patient reports no significant chest pain or shortness of breath and has been treating her fevers at home with ibuprofen.  She denies nausea vomiting, urinary symptoms or GI symptoms.  No other complaints today.  On exam, lungs are completely clear.  No wheezing, rhonchi, or crackles.  Chest and back are nontender.  Abdomen is nontender.  No leg pain or leg swelling.  Exam otherwise unremarkable.  Clinically we are concerned patient has a viral URI causing symptoms.  In the setting of the novel coronavirus pandemic, patient may have this.  Based on the policy of the hospital currently, we are not testing patient's he do not need admission for hypoxia.  Thus, patient will be instructed to self quarantine and follow-up with her outpatient PCP and return if any symptoms change or worsen.  Patient given handout on self quarantine recommendations and management.  At this time continue to feel patient does not need admission.  Given clear lungs, do not feel she needs chest x-ray.  Patient understood return precautions and plan of care.  Patient other questions or concerns and was discharged in good condition.         Final Clinical Impressions(s) / ED Diagnoses   Final diagnoses:  Cough  Upper respiratory tract infection, unspecified type    ED Discharge Orders    None     Clinical Impression: 1. Cough   2. Upper respiratory tract infection, unspecified type     Disposition: Discharge  Condition: Good  I have discussed the results, Dx and Tx plan with the pt(& family if present). He/she/they expressed  understanding and agree(s) with the plan. Discharge instructions discussed at great length. Strict return precautions discussed and pt &/or family have verbalized understanding of the instructions. No further questions at time of discharge.    New Prescriptions   No medications on file    Follow Up: [facility] [address] [place] [phone] Schedule an appointment as soon as possible for a visit    [facility] [address] [id number] [place] [phone]       Maudine Kluesner, Canary Brimhristopher J, MD 06/23/18 [id number]

## 2018-06-23 NOTE — ED Triage Notes (Signed)
Pt reports fever x 4 days. Pt reports highest temp was 100.0. Pt reports productive cough, denies sick contact. Pt afebrile upon checking in. Pt denies taking an antipyretics today. Pt denies recent travel.

## 2018-06-23 NOTE — Discharge Instructions (Signed)
Your history and exam today are consistent with viral upper respiratory infection causing a cough and fevers.  As we discussed, as you do not need to be admitted, our policy is not to test for the novel coronavirus however, we want you to self quarantine so as not to spread possible infection.  Please follow-up with your primary doctor.  If any symptoms change or worsen or get to where you are having significant chest pain or shortness of breath and cannot breathe, please return to nearest emergency department.      Person Under Monitoring Name: Tiffany Stephens  Location: [address] [place] [address]   Infection Prevention Recommendations for Individuals Confirmed to have, or Being Evaluated for, 2019 Novel Coronavirus (COVID-19) Infection Who Receive Care at Home  Individuals who are confirmed to have, or are being evaluated for, COVID-19 should follow the prevention steps below until a healthcare provider or local or state health department says they can return to normal activities.  Stay home except to get medical care You should restrict activities outside your home, except for getting medical care. Do not go to work, school, or public areas, and do not use public transportation or taxis.  Call ahead before visiting your doctor Before your medical appointment, call the healthcare provider and tell them that you have, or are being evaluated for, COVID-19 infection. This will help the healthcare providers office take steps to keep other people from getting infected. Ask your healthcare provider to call the local or state health department.  Monitor your symptoms Seek prompt medical attention if your illness is worsening (e.g., difficulty breathing). Before going to your medical appointment, call the healthcare provider and tell them that you have, or are being evaluated for, COVID-19 infection. Ask your healthcare provider to call the local or state health department.  Wear  a facemask You should wear a facemask that covers your nose and mouth when you are in the same room with other people and when you visit a healthcare provider. People who live with or visit you should also wear a facemask while they are in the same room with you.  Separate yourself from other people in your home As much as possible, you should stay in a different room from other people in your home. Also, you should use a separate bathroom, if available.  Avoid sharing household items You should not share dishes, drinking glasses, cups, eating utensils, towels, bedding, or other items with other people in your home. After using these items, you should wash them thoroughly with soap and water.  Cover your coughs and sneezes Cover your mouth and nose with a tissue when you cough or sneeze, or you can cough or sneeze into your sleeve. Throw used tissues in a lined trash can, and immediately wash your hands with soap and water for at least 20 seconds or use an alcohol-based hand rub.  Wash your [employer] your hands often and thoroughly with soap and water for at least 20 seconds. You can use an alcohol-based hand sanitizer if soap and water are not available and if your hands are not visibly dirty. Avoid touching your eyes, nose, and mouth with unwashed hands.   Prevention Steps for Caregivers and Household Members of Individuals Confirmed to have, or Being Evaluated for, COVID-19 Infection Being Cared for in the Home  If you live with, or provide care at home for, a person confirmed to have, or being evaluated for, COVID-19 infection please follow these  guidelines to prevent infection:  Follow healthcare providers instructions Make sure that you understand and can help the patient follow any healthcare provider instructions for all care.  Provide for the patients basic needs You should help the patient with basic needs in the home and provide support for getting groceries,  prescriptions, and other personal needs.  Monitor the patients symptoms If they are getting sicker, call his or her medical provider and tell them that the patient has, or is being evaluated for, COVID-19 infection. This will help the healthcare providers office take steps to keep other people from getting infected. Ask the healthcare provider to call the local or state health department.  Limit the number of people who have contact with the patient If possible, have only one caregiver for the patient. Other household members should stay in another home or place of residence. If this is not possible, they should stay in another room, or be separated from the patient as much as possible. Use a separate bathroom, if available. Restrict visitors who do not have an essential need to be in the home.  Keep older adults, very young children, and other sick people away from the patient Keep older adults, very young children, and those who have compromised immune systems or chronic health conditions away from the patient. This includes people with chronic heart, lung, or kidney conditions, diabetes, and cancer.  Ensure good ventilation Make sure that shared spaces in the home have good air flow, such as from an air conditioner or an opened window, weather permitting.  Wash your hands often Wash your hands often and thoroughly with soap and water for at least 20 seconds. You can use an alcohol based hand sanitizer if soap and water are not available and if your hands are not visibly dirty. Avoid touching your eyes, nose, and mouth with unwashed hands. Use disposable paper towels to dry your hands. If not available, use dedicated cloth towels and replace them when they become wet.  Wear a facemask and gloves Wear a disposable facemask at all times in the room and gloves when you touch or have contact with the patients blood, body fluids, and/or secretions or excretions, such as sweat, saliva,  sputum, nasal mucus, vomit, urine, or feces.  Ensure the mask fits over your nose and mouth tightly, and do not touch it during use. Throw out disposable facemasks and gloves after using them. Do not reuse. Wash your hands immediately after removing your facemask and gloves. If your personal clothing becomes contaminated, carefully remove clothing and launder. Wash your hands after handling contaminated clothing. Place all used disposable facemasks, gloves, and other waste in a lined container before disposing them with other household waste. Remove gloves and wash your hands immediately after handling these items.  Do not share dishes, glasses, or other household items with the patient Avoid sharing household items. You should not share dishes, drinking glasses, cups, eating utensils, towels, bedding, or other items with a patient who is confirmed to have, or being evaluated for, COVID-19 infection. After the person uses these items, you should wash them thoroughly with soap and water.  Wash laundry thoroughly Immediately remove and wash clothes or bedding that have blood, body fluids, and/or secretions or excretions, such as sweat, saliva, sputum, nasal mucus, vomit, urine, or feces, on them. Wear gloves when handling laundry from the patient. Read and follow directions on labels of laundry or clothing items and detergent. In general, wash and dry with the warmest temperatures  recommended on the label.  Clean all areas the individual has used often Clean all touchable surfaces, such as counters, tabletops, doorknobs, bathroom fixtures, toilets, phones, keyboards, tablets, and bedside tables, every day. Also, clean any surfaces that may have blood, body fluids, and/or secretions or excretions on them. Wear gloves when cleaning surfaces the patient has come in contact with. Use a diluted bleach solution (e.g., dilute bleach with 1 part bleach and 10 parts water) or a household disinfectant with a  label that says EPA-registered for coronaviruses. To make a bleach solution at home, add 1 tablespoon of bleach to 1 quart (4 cups) of water. For a larger supply, add  cup of bleach to 1 gallon (16 cups) of water. Read labels of cleaning products and follow recommendations provided on product labels. Labels contain instructions for safe and effective use of the cleaning product including precautions you should take when applying the product, such as wearing gloves or eye protection and making sure you have good ventilation during use of the product. Remove gloves and wash hands immediately after cleaning.  Monitor yourself for signs and symptoms of illness Caregivers and household members are considered close contacts, should monitor their health, and will be asked to limit movement outside of the home to the extent possible. Follow the monitoring steps for close contacts listed on the symptom monitoring form.   ? If you have additional questions, contact your local health department or call the epidemiologist on call at [phone] (available 24/7). ? This guidance is subject to change. For the most up-to-date guidance from [facility], please refer to their website: TripMetro.huhttps://www.cdc.gov/coronavirus/2019-ncov/hcp/guidance-prevent-spread.html

## 2019-03-22 ENCOUNTER — Other Ambulatory Visit: Payer: Self-pay

## 2019-03-22 DIAGNOSIS — Z20822 Contact with and (suspected) exposure to covid-19: Secondary | ICD-10-CM

## 2019-03-23 LAB — NOVEL CORONAVIRUS, NAA: SARS-CoV-2, NAA: DETECTED — AB

## 2019-03-24 ENCOUNTER — Telehealth: Payer: Self-pay | Admitting: Critical Care Medicine

## 2019-03-24 NOTE — Telephone Encounter (Signed)
I connect with this patient who is Covid positive from January 6 event.  She has no symptoms at this time.  She is not a monoclonal antibody candidate.  She knows to stay in isolation for 10 days from the time of her test.

## 2019-03-24 NOTE — Telephone Encounter (Signed)
I tried to reach out to this patient potential monoclonal antibody candidate due to her weight she is Covid positive from January 6 I left a message for her to call me back

## 2019-05-18 ENCOUNTER — Ambulatory Visit (HOSPITAL_COMMUNITY)
Admission: EM | Admit: 2019-05-18 | Discharge: 2019-05-18 | Disposition: A | Discharge status: Home / Self Care | Payer: Self-pay | Attending: Family Medicine | Admitting: Family Medicine

## 2019-05-18 ENCOUNTER — Encounter (HOSPITAL_COMMUNITY): Payer: Self-pay

## 2019-05-18 ENCOUNTER — Other Ambulatory Visit: Payer: Self-pay

## 2019-05-18 DIAGNOSIS — K0889 Other specified disorders of teeth and supporting structures: Secondary | ICD-10-CM

## 2019-05-18 DIAGNOSIS — R030 Elevated blood-pressure reading, without diagnosis of hypertension: Secondary | ICD-10-CM

## 2019-05-18 MED ORDER — HYDROCODONE-ACETAMINOPHEN 5-325 MG PO TABS: 1.0000 | ORAL_TABLET | Freq: Four times a day (QID) | ORAL | 0 refills | Status: DC | PRN

## 2019-05-18 MED ORDER — PENICILLIN V POTASSIUM 500 MG PO TABS: 500.0000 mg | ORAL_TABLET | Freq: Three times a day (TID) | ORAL | 0 refills | Status: DC

## 2019-05-18 NOTE — ED Provider Notes (Signed)
[facility] CARE CENTER   [id number] 05/18/19 Arrival Time: 0900  ASSESSMENT & PLAN:  1. Pain, dental   2. Elevated blood pressure reading without diagnosis of hypertension     No sign of abscess requiring I&D at this time. Discussed.  Meds ordered this encounter  Medications  . penicillin v potassium (VEETID) 500 MG tablet    Sig: Take 1 tablet (500 mg total) by mouth 3 (three) times daily.    Dispense:  30 tablet    Refill:  0  . HYDROcodone-acetaminophen (NORCO/VICODIN) 5-325 MG tablet    Sig: Take 1 tablet by mouth every 6 (six) hours as needed for moderate pain or severe pain.    Dispense:  8 tablet    Refill:  0     Discharge Instructions     Be aware, you have been prescribed pain medications that may cause drowsiness. While taking this medication, do not take any other medications containing acetaminophen (Tylenol). Do not combine with alcohol or other illicit drugs. Please do not drive, operate heavy machinery, or take part in activities that require making important decisions while on this medication as your judgement may be clouded.  Your blood pressure was noted to be elevated during your visit today. You may return here within the next few days to recheck if unable to see your primary care doctor. If your blood pressure remains persistently elevated, you may need to begin taking a medication.  BP (!) 159/86 (BP Location: Right Arm)   Pulse 64   Temp 98.3 F (36.8 C) (Oral)   Resp 18   Wt (!) 141.1 kg   LMP 04/26/2019   SpO2 100%   BMI 50.20 kg/m       [place] Controlled Substances Registry consulted for this patient. I feel the risk/benefit ratio today is favorable for proceeding with this prescription for a controlled substance. Medication sedation precautions given.  Dental resource written instructions given. She will schedule dental evaluation as soon as possible if not improving over the next 24-48 hours.  Reviewed expectations re: course of current  medical issues. Questions answered. Outlined signs and symptoms indicating need for more acute intervention. Patient verbalized understanding. After Visit Summary given.   SUBJECTIVE:  Tiffany Stephens is a 24 y.o. female who reports gradual onset of left lower dental pain described as aching. Present for 2-3 days. Fever: absent. Tolerating PO intake but reports pain with chewing. Normal swallowing. She does not see a dentist regularly. No neck swelling or pain. OTC analgesics without relief.  Increased blood pressure noted today. Reports that she has not been treated for hypertension in the past.  She reports no chest pain on exertion, no dyspnea on exertion, no swelling of ankles, no orthostatic dizziness or lightheadedness, no orthopnea or paroxysmal nocturnal dyspnea, no palpitations and no intermittent claudication symptoms.   OBJECTIVE: Vitals:   05/18/19 0936 05/18/19 0938  BP:  (!) 159/86  Pulse:  64  Resp:  18  Temp:  98.3 F (36.8 C)  TempSrc:  Oral  SpO2:  100%  Weight: (!) 141.1 kg     General appearance: alert; no distress HENT: normocephalic; atraumatic; dentition: fair; left lower gums without areas of fluctuance, drainage, or bleeding and with tenderness to palpation; normal jaw movement without difficulty Neck: supple without LAD; FROM; trachea midline Lungs: normal respirations; unlabored Skin: warm and dry Psychological: alert and cooperative; normal mood and affect  No Known Allergies  History reviewed. No pertinent past medical history.   Social  History   Socioeconomic History  . Marital status: Single    Spouse name: Not on file  . Number of children: Not on file  . Years of education: Not on file  . Highest education level: Not on file  Occupational History  . Not on file  Tobacco Use  . Smoking status: Current Every Day Smoker  . Smokeless tobacco: Never Used  Substance and Sexual Activity  . Alcohol use: Yes  . Drug use: No  . Sexual  activity: Not on file  Other Topics Concern  . Not on file  Social History Narrative  . Not on file   Social Determinants of Health   Financial Resource Strain:   . Difficulty of Paying Living Expenses: Not on file  Food Insecurity:   . Worried About Charity fundraiser in the Last Year: Not on file  . Ran Out of Food in the Last Year: Not on file  Transportation Needs:   . Lack of Transportation (Medical): Not on file  . Lack of Transportation (Non-Medical): Not on file  Physical Activity:   . Days of Exercise per Week: Not on file  . Minutes of Exercise per Session: Not on file  Stress:   . Feeling of Stress : Not on file  Social Connections:   . Frequency of Communication with Friends and Family: Not on file  . Frequency of Social Gatherings with Friends and Family: Not on file  . Attends Religious Services: Not on file  . Active Member of Clubs or Organizations: Not on file  . Attends Archivist Meetings: Not on file  . Marital Status: Not on file  Intimate Partner Violence:   . Fear of Current or Ex-Partner: Not on file  . Emotionally Abused: Not on file  . Physically Abused: Not on file  . Sexually Abused: Not on file   Family History  Problem Relation Age of Onset  . Diabetes Mother   . Heart disease Mother    History reviewed. No pertinent surgical history.   Vanessa Kick, MD 05/18/19 1016

## 2019-05-18 NOTE — Discharge Instructions (Signed)
Be aware, you have been prescribed pain medications that may cause drowsiness. While taking this medication, do not take any other medications containing acetaminophen (Tylenol). Do not combine with alcohol or other illicit drugs. Please do not drive, operate heavy machinery, or take part in activities that require making important decisions while on this medication as your judgement may be clouded.  Your blood pressure was noted to be elevated during your visit today. You may return here within the next few days to recheck if unable to see your primary care doctor. If your blood pressure remains persistently elevated, you may need to begin taking a medication.  BP (!) 159/86 (BP Location: Right Arm)    Pulse 64    Temp 98.3 F (36.8 C) (Oral)    Resp 18    Wt (!) 141.1 kg    LMP 04/26/2019    SpO2 100%    BMI 50.20 kg/m

## 2019-05-18 NOTE — ED Triage Notes (Signed)
Pt states she has a toothache x 3 days. Pt states it's the right side of her mouth on the bottom.

## 2019-08-31 IMAGING — DX DG HAND COMPLETE 3+V*R*
3 series · 3 of 3 positions shown · non-contrast
Comparison: None.

CLINICAL DATA: Laceration to the right posterior hand between the
fourth and fifth metacarpal phalangeal joints.

EXAM:
RIGHT HAND - COMPLETE 3+ VIEW

[hand pa]
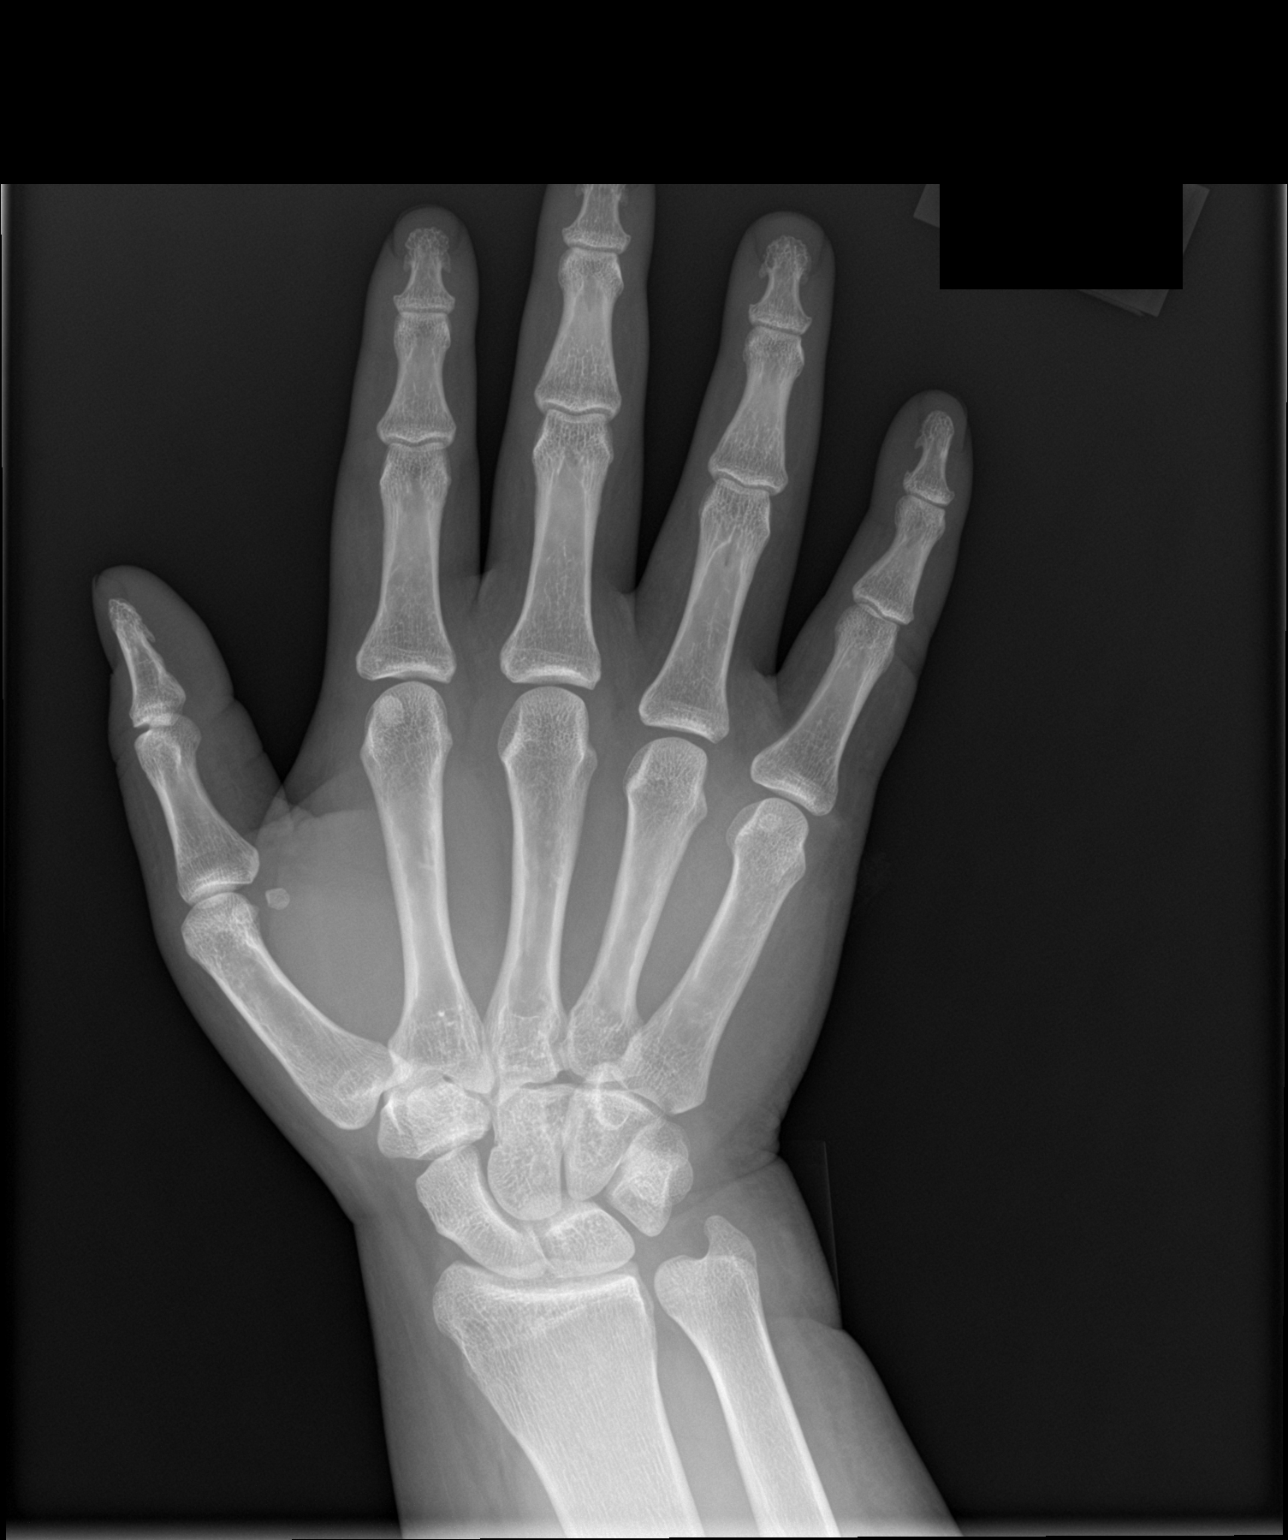

[hand obl]
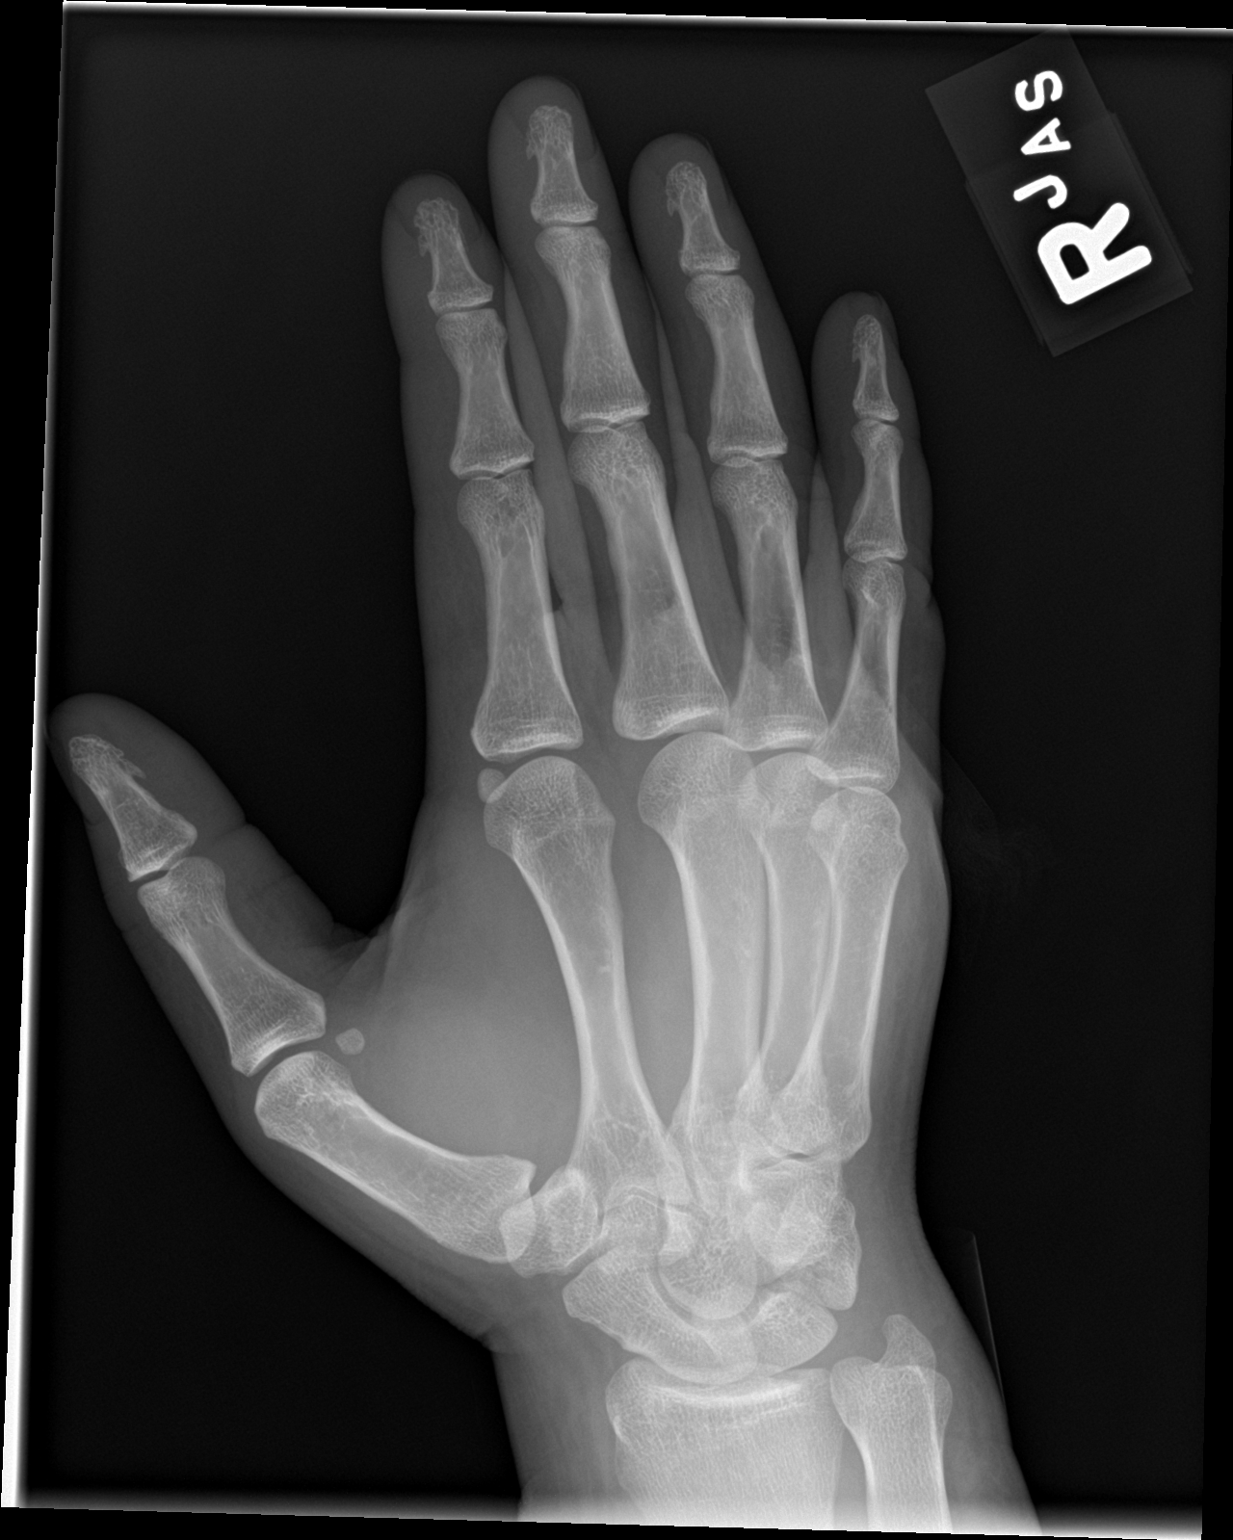

[hand lat]
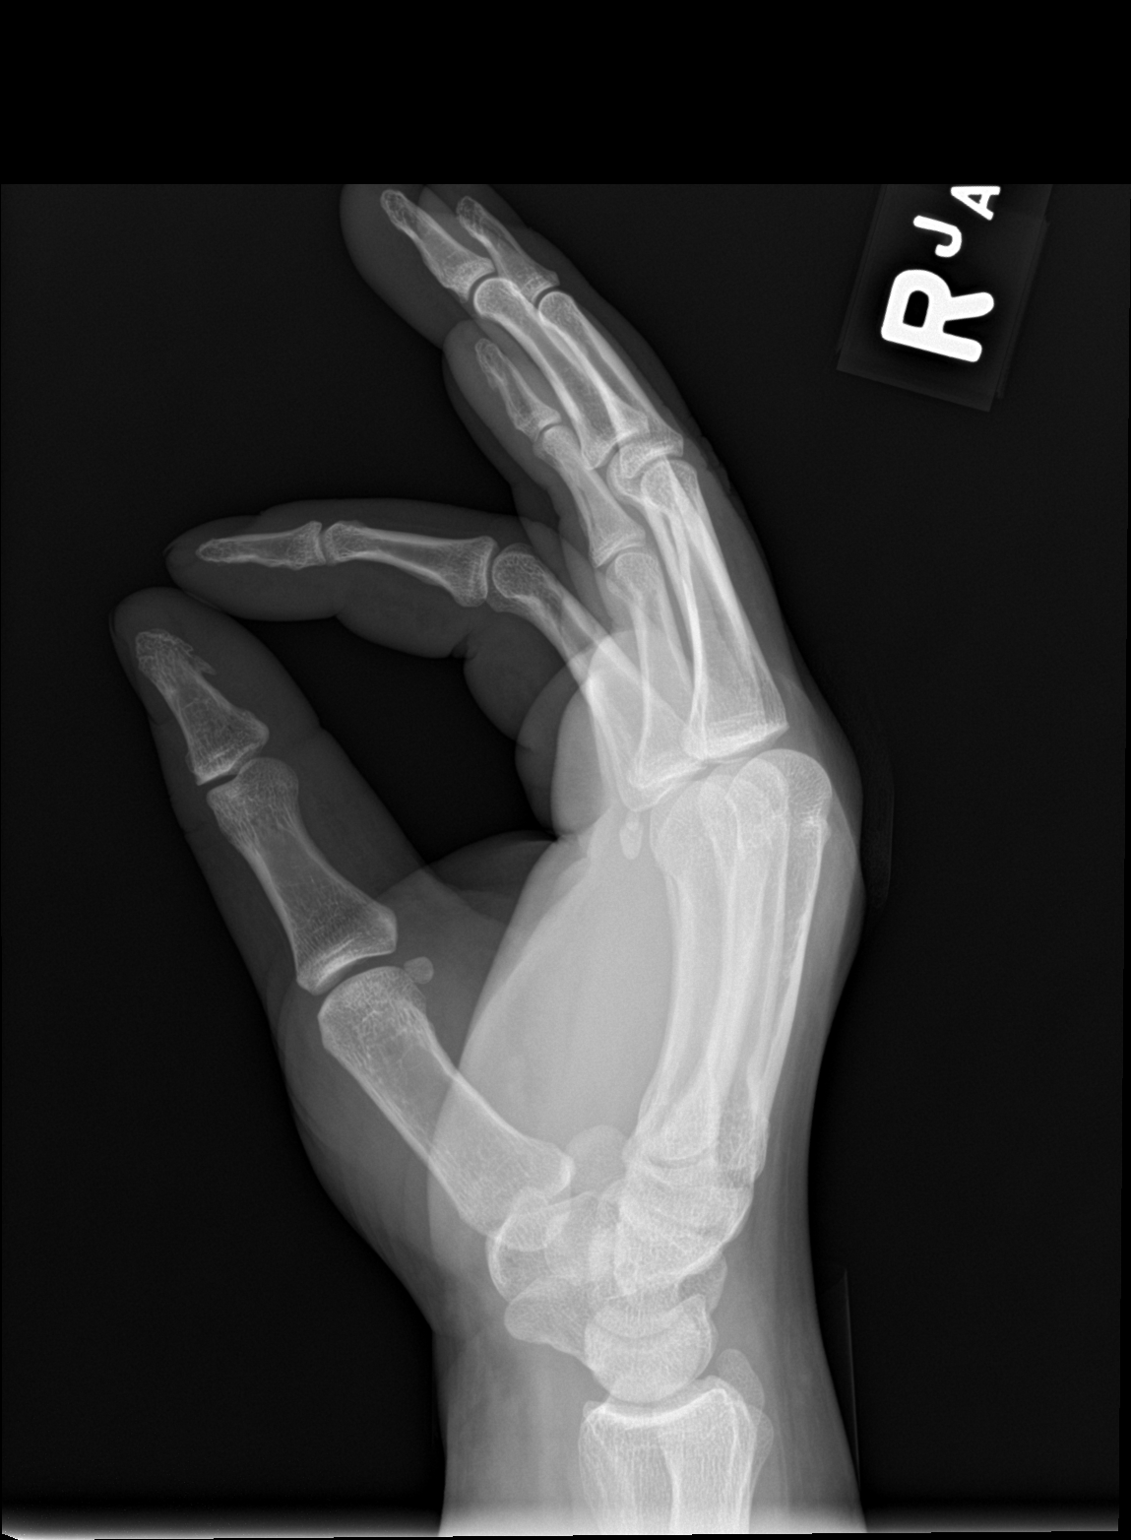

[3 of 3 positions shown; findings below may reference images not displayed]

FINDINGS: Soft tissue swelling over the dorsum of the hand at the level of the
MCP joints. Reported laceration is radiographically occult. No
radiopaque foreign body nor acute osseous abnormality. Joint spaces
are maintained. Carpal rows are intact. Distal radius and ulna are
nonacute.
IMPRESSION: Soft tissue swelling over the dorsum of the hand. No radiopaque
foreign body nor acute osseous abnormality.

## 2019-10-17 ENCOUNTER — Encounter (HOSPITAL_COMMUNITY): Payer: Self-pay

## 2019-10-17 ENCOUNTER — Other Ambulatory Visit: Payer: Self-pay

## 2019-10-17 ENCOUNTER — Ambulatory Visit (HOSPITAL_COMMUNITY)
Admission: EM | Admit: 2019-10-17 | Discharge: 2019-10-17 | Disposition: A | Discharge status: Home / Self Care | Payer: HRSA Program | Attending: Family Medicine | Admitting: Family Medicine

## 2019-10-17 DIAGNOSIS — R05 Cough: Secondary | ICD-10-CM | POA: Diagnosis not present

## 2019-10-17 DIAGNOSIS — Z20822 Contact with and (suspected) exposure to covid-19: Secondary | ICD-10-CM | POA: Diagnosis not present

## 2019-10-17 DIAGNOSIS — Z6841 Body Mass Index (BMI) 40.0 and over, adult: Secondary | ICD-10-CM | POA: Insufficient documentation

## 2019-10-17 DIAGNOSIS — R0981 Nasal congestion: Secondary | ICD-10-CM | POA: Diagnosis present

## 2019-10-17 DIAGNOSIS — J069 Acute upper respiratory infection, unspecified: Secondary | ICD-10-CM | POA: Insufficient documentation

## 2019-10-17 DIAGNOSIS — R439 Unspecified disturbances of smell and taste: Secondary | ICD-10-CM | POA: Diagnosis not present

## 2019-10-17 DIAGNOSIS — F17200 Nicotine dependence, unspecified, uncomplicated: Secondary | ICD-10-CM | POA: Diagnosis not present

## 2019-10-17 DIAGNOSIS — R430 Anosmia: Secondary | ICD-10-CM

## 2019-10-17 DIAGNOSIS — R432 Parageusia: Secondary | ICD-10-CM

## 2019-10-17 LAB — SARS CORONAVIRUS 2 (TAT 6-24 HRS): SARS Coronavirus 2: NEGATIVE

## 2019-10-17 MED ORDER — PROMETHAZINE-DM 6.25-15 MG/5ML PO SYRP
5.0000 mL | ORAL_SOLUTION | Freq: Every evening | ORAL | 0 refills | Status: DC | PRN
Start: 2019-10-17 — End: 2021-07-10

## 2019-10-17 MED ORDER — CETIRIZINE HCL 10 MG PO TABS: 10.0000 mg | ORAL_TABLET | Freq: Every day | ORAL | 0 refills | Status: AC

## 2019-10-17 MED ORDER — BENZONATATE 100 MG PO CAPS: 100.0000 mg | ORAL_CAPSULE | Freq: Three times a day (TID) | ORAL | 0 refills | Status: DC | PRN

## 2019-10-17 MED ORDER — PSEUDOEPHEDRINE HCL 30 MG PO TABS
30.0000 mg | ORAL_TABLET | Freq: Three times a day (TID) | ORAL | 0 refills | Status: DC | PRN
Start: 2019-10-17 — End: 2021-07-10

## 2019-10-17 NOTE — ED Triage Notes (Addendum)
Pt is here with a loss of taste/smell that started 2 days ago, pt has taken cough drops, Aleve to relieve discomfort.

## 2019-10-17 NOTE — ED Provider Notes (Addendum)
[facility]   MRN: [id number] DOB: 09-17-1995  Subjective:   Tiffany Stephens is a 24 y.o. female presenting for 2-day history of acute onset nasal congestion, cough, loss of sense of taste and smell.  Patient had COVID-19 back in January and reports that she had similar symptoms then.  She eventually recovered including her sense of taste and smell.  Did not get vaccination.  Denies chest pain, shortness of breath, fevers.  He is a smoker.  Denies history of asthma, COPD.  Denies taking chronic medications.   No Known Allergies  No past medical history on file.   No past surgical history on file.  Family History  Problem Relation Age of Onset  . Diabetes Mother   . Heart disease Mother   . Healthy Father     Social History   Tobacco Use  . Smoking status: Current Every Day Smoker  . Smokeless tobacco: Never Used  Vaping Use  . Vaping Use: Never used  Substance Use Topics  . Alcohol use: Yes  . Drug use: No    ROS   Objective:   Vitals: BP 120/77 (BP Location: Right Arm)   Pulse 65   Temp 98 F (36.7 C) (Oral)   Resp 20   Wt (!) 325 lb 9.6 oz (147.7 kg)   LMP 10/10/2019   SpO2 100%   BMI 52.55 kg/m   Physical Exam Constitutional:      General: She is not in acute distress.    Appearance: Normal appearance. She is well-developed. She is obese. She is not ill-appearing, toxic-appearing or diaphoretic.  HENT:     Head: Normocephalic and atraumatic.     Nose: Nose normal.     Mouth/Throat:     Mouth: Mucous membranes are moist.  Eyes:     Extraocular Movements: Extraocular movements intact.     Pupils: Pupils are equal, round, and reactive to light.  Cardiovascular:     Rate and Rhythm: Normal rate and regular rhythm.     Pulses: Normal pulses.     Heart sounds: Normal heart sounds. No murmur heard.  No friction rub. No gallop.   Pulmonary:     Effort: Pulmonary effort is normal. No respiratory distress.     Breath sounds: Normal breath  sounds. No stridor. No wheezing, rhonchi or rales.  Skin:    General: Skin is warm and dry.     Findings: No rash.  Neurological:     Mental Status: She is alert and oriented to person, place, and time.  Psychiatric:        Mood and Affect: Mood normal.        Behavior: Behavior normal.        Thought Content: Thought content normal.      Assessment and Plan :   PDMP not reviewed this encounter.  1. Viral URI with cough   2. Nasal congestion   3. Loss of taste   4. Loss of smell     Will manage for viral illness such as viral URI, viral syndrome, viral rhinitis, COVID-19. Counseled patient on nature of COVID-19 including modes of transmission, diagnostic testing, management and supportive care.  Offered scripts for symptomatic relief. COVID 19 testing is pending.  Patient is much higher risk given her morbid obesity, second possible COVID-19 infection.  I expect she would be a good candidate for infusion clinic treatment.  Counseled patient on potential for adverse effects with medications prescribed/recommended today, ER and return-to-clinic precautions  discussed, patient verbalized understanding.     Wallis Bamberg, PA-C 10/17/19 1312

## 2019-10-17 NOTE — Discharge Instructions (Signed)

## 2020-07-17 ENCOUNTER — Emergency Department (HOSPITAL_COMMUNITY)
Admission: EM | Admit: 2020-07-17 | Discharge: 2020-07-17 | Disposition: A | Discharge status: Home / Self Care | Payer: Self-pay | Attending: Emergency Medicine | Admitting: Emergency Medicine

## 2020-07-17 ENCOUNTER — Other Ambulatory Visit: Payer: Self-pay

## 2020-07-17 ENCOUNTER — Encounter (HOSPITAL_COMMUNITY): Payer: Self-pay

## 2020-07-17 ENCOUNTER — Ambulatory Visit (HOSPITAL_COMMUNITY)
Admission: EM | Admit: 2020-07-17 | Discharge: 2020-07-17 | Disposition: A | Discharge status: Home / Self Care | Payer: Self-pay | Attending: Urgent Care | Admitting: Urgent Care

## 2020-07-17 DIAGNOSIS — M79605 Pain in left leg: Secondary | ICD-10-CM | POA: Insufficient documentation

## 2020-07-17 DIAGNOSIS — Z5321 Procedure and treatment not carried out due to patient leaving prior to being seen by health care provider: Secondary | ICD-10-CM | POA: Insufficient documentation

## 2020-07-17 DIAGNOSIS — M79662 Pain in left lower leg: Secondary | ICD-10-CM

## 2020-07-17 DIAGNOSIS — S86812A Strain of other muscle(s) and tendon(s) at lower leg level, left leg, initial encounter: Secondary | ICD-10-CM

## 2020-07-17 MED ORDER — TIZANIDINE HCL 4 MG PO TABS: 4.0000 mg | ORAL_TABLET | Freq: Three times a day (TID) | ORAL | 0 refills | Status: DC | PRN

## 2020-07-17 MED ORDER — NAPROXEN 375 MG PO TABS
375.0000 mg | ORAL_TABLET | Freq: Two times a day (BID) | ORAL | 0 refills | Status: DC
Start: 2020-07-17 — End: 2021-07-10

## 2020-07-17 NOTE — ED Provider Notes (Signed)
[facility]   MRN: [id number] DOB: 12/30/1995  Subjective:   Tiffany Stephens is a 25 y.o. female presenting for several week history of persistent left calf pain.  Patient states that she feels a tightness and stiffness.  No fall, trauma, history of blood clots.  No redness, warmth or specific traumas.  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  Has not used any pain medications.  No current facility-administered medications for this encounter.  Current Outpatient Medications:  .  benzonatate (TESSALON) 100 MG capsule, Take 1-2 capsules (100-200 mg total) by mouth 3 (three) times daily as needed., Disp: 60 capsule, Rfl: 0 .  cetirizine (ZYRTEC ALLERGY) 10 MG tablet, Take 1 tablet (10 mg total) by mouth daily., Disp: 30 tablet, Rfl: 0 .  HYDROcodone-acetaminophen (NORCO/VICODIN) 5-325 MG tablet, Take 1 tablet by mouth every 6 (six) hours as needed for moderate pain or severe pain., Disp: 8 tablet, Rfl: 0 .  promethazine-dextromethorphan (PROMETHAZINE-DM) 6.25-15 MG/5ML syrup, Take 5 mLs by mouth at bedtime as needed for cough., Disp: 100 mL, Rfl: 0 .  pseudoephedrine (SUDAFED) 30 MG tablet, Take 1 tablet (30 mg total) by mouth every 8 (eight) hours as needed for congestion., Disp: 30 tablet, Rfl: 0   No Known Allergies  History reviewed. No pertinent past medical history.   History reviewed. No pertinent surgical history.  Family History  Problem Relation Age of Onset  . Diabetes Mother   . Heart disease Mother   . Healthy Father     Social History   Tobacco Use  . Smoking status: Current Every Day Smoker  . Smokeless tobacco: Never Used  Vaping Use  . Vaping Use: Never used  Substance Use Topics  . Alcohol use: Yes  . Drug use: No    ROS   Objective:   Vitals: BP 114/80   Pulse 60   Temp 98.3 F (36.8 C)   Resp 18   LMP 07/14/2020 (Exact Date)   SpO2 98%    Physical Exam Constitutional:      General: She is not in acute distress.    Appearance: Normal appearance. She is well-developed. She is not ill-appearing, toxic-appearing or diaphoretic.  HENT:     Head: Normocephalic and atraumatic.     Nose: Nose normal.     Mouth/Throat:     Mouth: Mucous membranes are moist.     Pharynx: Oropharynx is clear.  Eyes:     General: No scleral icterus.       Right eye: No discharge.        Left eye: No discharge.     Extraocular Movements: Extraocular movements intact.     Conjunctiva/sclera: Conjunctivae normal.     Pupils: Pupils are equal, round, and reactive to light.  Cardiovascular:     Rate and Rhythm: Normal rate.  Pulmonary:     Effort: Pulmonary effort is normal.  Musculoskeletal:     Right lower leg: No swelling, deformity, lacerations, tenderness or bony tenderness. No edema.     Left lower leg: Tenderness (mild over calf muscle) present. No swelling, deformity, lacerations or bony tenderness. No edema.     Comments: Left calf measures 50 cm at 10 cm just inferior to the patella.  Right calf measures 49.5 cm at the same distance.  Skin:    General: Skin is warm and dry.  Neurological:  General: No focal deficit present.     Mental Status: She is alert and oriented to person, place, and time.     Motor: No weakness.     Coordination: Coordination normal.     Gait: Gait normal.     Deep Tendon Reflexes: Reflexes normal.  Psychiatric:        Mood and Affect: Mood normal.        Behavior: Behavior normal.        Thought Content: Thought content normal.        Judgment: Judgment normal.     Assessment and Plan :   PDMP not reviewed this encounter.  1. Pain of left calf   2. Strain of calf muscle, left, initial encounter     Low suspicion for dvt given physical exam findings without erythema, warmth and swelling. No history of clotting disorders. Will use conservative management including naproxen, tizanidine. Follow up  with ortho. Counseled patient on potential for adverse effects with medications prescribed/recommended today, ER and return-to-clinic precautions discussed, patient verbalized understanding.    Wallis Bamberg, PA-C 07/17/20 2016

## 2020-07-17 NOTE — ED Triage Notes (Signed)
Pt in with c/o left calf pain that has been going on for a few weeks but worsened today  Pt has not had medication for sxs

## 2020-07-17 NOTE — ED Triage Notes (Signed)
Pt reports L calf muscle pain since getting out of the car quickly today. Denies swelling or redness.

## 2020-07-17 NOTE — ED Provider Notes (Signed)
Emergency Medicine Provider Triage Evaluation Note  Tiffany Stephens 25 y.o. female was evaluated in triage.  Pt complains of left leg pain that began today.  Patient reports she was getting out of car and turned really fast and since then has had pain to her left calf.  It has not been red hot or swollen.  She denies any numbness/weakness.  She has been able to ambulate and bear weight on it.   Review of Systems  Positive: Leg pain  Negative: Fever, n/w, color changes  Physical Exam  BP 134/82   Pulse 70   Temp 98.2 F (36.8 C) (Oral)   Resp 18   Ht 5\' 4"  (1.626 m)   Wt 65.8 kg   SpO2 100%   BMI 24.89 kg/m  Gen:   Awake, no distress   HEENT:  Atraumatic  Resp:  Normal effort  Cardiac:  Normal rate. 2+ DP LL Abd:   Nondistended, nontender  MSK:   Moves extremities without difficulty.  Tenderness palpation noted to left calf.  No overlying warmth, erythema, edema.  Plantarflexion dorsiflexion intact without difficulty. Neuro:  Speech clear   Medical Decision Making  Medically screening exam initiated at 3:55 AM.  Appropriate orders placed.  Tiffany Stephens was informed that the remainder of the evaluation will be completed by another provider, this initial triage assessment does not replace that evaluation, and the importance of remaining in the ED until their evaluation is complete.   Clinical Impression  Leg pain   Portions of this note were generated with Dragon dictation software. Dictation errors may occur despite best attempts at proofreading.     Dinah Beers, PA-C 07/17/20 1725    09/16/20, MD 07/17/20 2047

## 2020-08-06 ENCOUNTER — Other Ambulatory Visit: Payer: Self-pay

## 2020-08-06 ENCOUNTER — Ambulatory Visit (HOSPITAL_COMMUNITY)
Admission: EM | Admit: 2020-08-06 | Discharge: 2020-08-06 | Disposition: A | Discharge status: Home / Self Care | Payer: Self-pay

## 2020-08-06 ENCOUNTER — Encounter (HOSPITAL_COMMUNITY): Payer: Self-pay | Admitting: *Deleted

## 2020-08-06 DIAGNOSIS — J069 Acute upper respiratory infection, unspecified: Secondary | ICD-10-CM

## 2020-08-06 NOTE — ED Triage Notes (Signed)
Pt reports congestion,runny nose ,Rt ear pain for 2 days

## 2020-08-06 NOTE — ED Provider Notes (Signed)
[facility]    CSN: [id number] Arrival date & time: 08/06/20  1757      History   Chief Complaint Chief Complaint  Patient presents with  . Cough  . Nasal Congestion    HPI Tiffany Stephens is a 25 y.o. female.   Patient here for evaluation of congestion, body aches, and right ear pain that has been ongoing for the past 2 days.  Reports taking OTC medications with minimal relief.  Reports recently around niece who had a "cold."  Denies any trauma, injury, or other precipitating event.  Denies any specific alleviating or aggravating factors.  Denies any fevers, chest pain, shortness of breath, N/V/D, numbness, tingling, weakness, abdominal pain, or headaches.    The history is provided by the patient.    History reviewed. No pertinent past medical history.  Patient Active Problem List   Diagnosis Date Noted  . Menorrhagia 07/29/2012    History reviewed. No pertinent surgical history.  OB History   No obstetric history on file.      Home Medications    Prior to Admission medications   Medication Sig Start Date End Date Taking? Authorizing Provider  benzonatate (TESSALON) 100 MG capsule Take 1-2 capsules (100-200 mg total) by mouth 3 (three) times daily as needed. 10/17/19   Wallis Bamberg, PA-C  cetirizine (ZYRTEC ALLERGY) 10 MG tablet Take 1 tablet (10 mg total) by mouth daily. 10/17/19   Wallis Bamberg, PA-C  HYDROcodone-acetaminophen (NORCO/VICODIN) 5-325 MG tablet Take 1 tablet by mouth every 6 (six) hours as needed for moderate pain or severe pain. 05/18/19   Mardella Layman, MD  naproxen (NAPROSYN) 375 MG tablet Take 1 tablet (375 mg total) by mouth 2 (two) times daily with a meal. 07/17/20   Wallis Bamberg, PA-C  promethazine-dextromethorphan (PROMETHAZINE-DM) 6.25-15 MG/5ML syrup Take 5 mLs by mouth at bedtime as needed for cough. 10/17/19   Wallis Bamberg, PA-C  pseudoephedrine (SUDAFED) 30 MG tablet Take 1 tablet (30 mg total) by mouth every 8 (eight) hours as needed for  congestion. 10/17/19   Wallis Bamberg, PA-C  tiZANidine (ZANAFLEX) 4 MG tablet Take 1 tablet (4 mg total) by mouth every 8 (eight) hours as needed. 07/17/20   Wallis Bamberg, PA-C    Family History Family History  Problem Relation Age of Onset  . Diabetes Mother   . Heart disease Mother   . Healthy Father     Social History Social History   Tobacco Use  . Smoking status: Current Every Day Smoker  . Smokeless tobacco: Never Used  Vaping Use  . Vaping Use: Never used  Substance Use Topics  . Alcohol use: Yes  . Drug use: No     Allergies   Patient has no known allergies.   Review of Systems Review of Systems  HENT: Positive for congestion, ear pain and sore throat.   Respiratory: Positive for cough.   All other systems reviewed and are negative.    Physical Exam Triage Vital Signs ED Triage Vitals  Enc Vitals Group     BP 08/06/20 1829 (!) 133/105     Pulse Rate 08/06/20 1829 70     Resp --      Temp 08/06/20 1829 98.5 F (36.9 C)     Temp src --      SpO2 08/06/20 1829 100 %     Weight --      Height --      Head Circumference --  Peak Flow --      Pain Score 08/06/20 1826 6     Pain Loc --      Pain Edu? --      Excl. in GC? --    No data found.  Updated Vital Signs BP (!) 133/105   Pulse 70   Temp 98.5 F (36.9 C)   LMP 07/17/2020 (Exact Date)   SpO2 100%   Visual Acuity Right Eye Distance:   Left Eye Distance:   Bilateral Distance:    Right Eye Near:   Left Eye Near:    Bilateral Near:     Physical Exam Vitals and nursing note reviewed.  Constitutional:      General: She is not in acute distress.    Appearance: Normal appearance. She is not ill-appearing, toxic-appearing or diaphoretic.  HENT:     Head: Normocephalic and atraumatic.     Right Ear: Tympanic membrane, ear canal and external ear normal.     Left Ear: Tympanic membrane, ear canal and external ear normal.     Nose: Congestion and rhinorrhea present.     Mouth/Throat:      Mouth: Mucous membranes are moist.  Eyes:     Conjunctiva/sclera: Conjunctivae normal.  Cardiovascular:     Rate and Rhythm: Normal rate and regular rhythm.     Pulses: Normal pulses.     Heart sounds: Normal heart sounds.  Pulmonary:     Effort: Pulmonary effort is normal.     Breath sounds: Normal breath sounds.  Abdominal:     General: Abdomen is flat.  Musculoskeletal:        General: Normal range of motion.     Cervical back: Normal range of motion.  Skin:    General: Skin is warm and dry.  Neurological:     General: No focal deficit present.     Mental Status: She is alert and oriented to person, place, and time.  Psychiatric:        Mood and Affect: Mood normal.      UC Treatments / Results  Labs (all labs ordered are listed, but only abnormal results are displayed) Labs Reviewed - No data to display  EKG   Radiology No results found.  Procedures Procedures (including critical care time)  Medications Ordered in UC Medications - No data to display  Initial Impression / Assessment and Plan / UC Course  I have reviewed the triage vital signs and the nursing notes.  Pertinent labs & imaging results that were available during my care of the patient were reviewed by me and considered in my medical decision making (see chart for details).    Assessment negative for red flags or concerns.  Likely viral URI.  Offered COVID testing but declines.  Discussed conservative symptom management as described in discharge instructions.  Recommend daily antihistamine such as Zyrtec or Claritin.  Recommend Flonase 2 sprays in each nostril daily.  Encourage fluids and rest.  Follow-up with primary care as needed Final Clinical Impressions(s) / UC Diagnoses   Final diagnoses:  Viral upper respiratory tract infection     Discharge Instructions     You most likely have viral illness.   I would recommend taking a daily Zyrtec/Claritin/Allegra/Xyzal and Flonase 2 sprays in  each nostril daily.   You can take Tylenol and/or Ibuprofen as needed for fever reduction and pain relief.    For cough: honey 1/2 to 1 teaspoon (you can dilute the honey in water or another  fluid).  You can also use guaifenesin and dextromethorphan for cough. You can use a humidifier for chest congestion and cough.  If you don't have a humidifier, you can sit in the bathroom with the hot shower running.     For sore throat: try warm salt water gargles, cepacol lozenges, throat spray, warm tea or water with lemon/honey, popsicles or ice, or OTC cold relief medicine for throat discomfort.    For congestion: take a daily anti-histamine like Zyrtec, Claritin, and a oral decongestant, such as pseudoephedrine.  You can also use Flonase 1-2 sprays in each nostril daily.    It is important to stay hydrated: drink plenty of fluids (water, gatorade/powerade/pedialyte, juices, or teas) to keep your throat moisturized and help further relieve irritation/discomfort.   Return or go to the Emergency Department if symptoms worsen or do not improve in the next few days.      ED Prescriptions    None     PDMP not reviewed this encounter.   Ivette Loyal, NP 08/06/20 [phone]

## 2020-08-06 NOTE — Discharge Instructions (Signed)
You most likely have viral illness.   I would recommend taking a daily Zyrtec/Claritin/Allegra/Xyzal and Flonase 2 sprays in each nostril daily.   You can take Tylenol and/or Ibuprofen as needed for fever reduction and pain relief.    For cough: honey 1/2 to 1 teaspoon (you can dilute the honey in water or another fluid).  You can also use guaifenesin and dextromethorphan for cough. You can use a humidifier for chest congestion and cough.  If you don't have a humidifier, you can sit in the bathroom with the hot shower running.     For sore throat: try warm salt water gargles, cepacol lozenges, throat spray, warm tea or water with lemon/honey, popsicles or ice, or OTC cold relief medicine for throat discomfort.    For congestion: take a daily anti-histamine like Zyrtec, Claritin, and a oral decongestant, such as pseudoephedrine.  You can also use Flonase 1-2 sprays in each nostril daily.    It is important to stay hydrated: drink plenty of fluids (water, gatorade/powerade/pedialyte, juices, or teas) to keep your throat moisturized and help further relieve irritation/discomfort.   Return or go to the Emergency Department if symptoms worsen or do not improve in the next few days.

## 2020-08-27 ENCOUNTER — Other Ambulatory Visit: Payer: Self-pay

## 2020-08-27 ENCOUNTER — Encounter (HOSPITAL_COMMUNITY): Payer: Self-pay | Admitting: Emergency Medicine

## 2020-08-27 ENCOUNTER — Ambulatory Visit (HOSPITAL_COMMUNITY)
Admission: EM | Admit: 2020-08-27 | Discharge: 2020-08-27 | Disposition: A | Discharge status: Home / Self Care | Payer: Self-pay | Attending: Physician Assistant | Admitting: Physician Assistant

## 2020-08-27 DIAGNOSIS — K0889 Other specified disorders of teeth and supporting structures: Secondary | ICD-10-CM

## 2020-08-27 DIAGNOSIS — K047 Periapical abscess without sinus: Secondary | ICD-10-CM

## 2020-08-27 MED ORDER — AMOXICILLIN-POT CLAVULANATE 875-125 MG PO TABS
1.0000 | ORAL_TABLET | Freq: Two times a day (BID) | ORAL | 0 refills | Status: DC
Start: 2020-08-27 — End: 2021-07-10

## 2020-08-27 MED ORDER — AMOXICILLIN-POT CLAVULANATE 875-125 MG PO TABS
1.0000 | ORAL_TABLET | Freq: Two times a day (BID) | ORAL | 0 refills | Status: DC
Start: 2020-08-27 — End: 2020-08-27

## 2020-08-27 NOTE — Discharge Instructions (Signed)
Take Augmentin twice a day for 7 days to cover for infection.  Alternate Tylenol and ibuprofen for pain relief.  Follow-up with a dentist as you will likely continue to have symptoms until tooth is repaired.  If you have any trouble speaking, trouble swallowing, shortness of breath you need to go to emergency room.  If symptoms or not improving within 48 to 72 hours of starting medication please return for reevaluation.

## 2020-08-27 NOTE — ED Triage Notes (Signed)
Pt presents with left bottom dental pain xs 3 days. States has gargled with peroxide with some relief.

## 2020-08-27 NOTE — ED Provider Notes (Signed)
[facility]    CSN: [id number] Arrival date & time: 08/27/20  1825      History   Chief Complaint Chief Complaint  Patient presents with   Dental Pain    HPI Tiffany Stephens is a 25 y.o. female.   Patient presents today with 3-day history of left lower posterior dental pain.  She reports pain is rated 6 on a 0-10 pain scale, localized to affected area without radiation, described as aching/throbbing, worse with mastication, no alleviating factors identified.  She has tried gargling with hydrogen peroxide without improvement of symptoms.  She does have a history of broken tooth in this area that often gets infected and states current symptoms are similar to previous episodes of this condition.  She denies any recent antibiotic use.  She has not seen a dentist recently.  She denies any fever, chest pain, shortness of breath, nausea, vomiting, dysphagia, muffled voice.  She is eating and drinking normally despite symptoms.   History reviewed. No pertinent past medical history.  Patient Active Problem List   Diagnosis Date Noted   Menorrhagia 07/29/2012    History reviewed. No pertinent surgical history.  OB History   No obstetric history on file.      Home Medications    Prior to Admission medications   Medication Sig Start Date End Date Taking? Authorizing Provider  amoxicillin-clavulanate (AUGMENTIN) 875-125 MG tablet Take 1 tablet by mouth every 12 (twelve) hours. 08/27/20   Yoshi Mancillas, Noberto Retort, PA-C  benzonatate (TESSALON) 100 MG capsule Take 1-2 capsules (100-200 mg total) by mouth 3 (three) times daily as needed. 10/17/19   Wallis Bamberg, PA-C  cetirizine (ZYRTEC ALLERGY) 10 MG tablet Take 1 tablet (10 mg total) by mouth daily. 10/17/19   Wallis Bamberg, PA-C  HYDROcodone-acetaminophen (NORCO/VICODIN) 5-325 MG tablet Take 1 tablet by mouth every 6 (six) hours as needed for moderate pain or severe pain. 05/18/19   Mardella Layman, MD  naproxen (NAPROSYN) 375 MG tablet Take 1  tablet (375 mg total) by mouth 2 (two) times daily with a meal. 07/17/20   Wallis Bamberg, PA-C  promethazine-dextromethorphan (PROMETHAZINE-DM) 6.25-15 MG/5ML syrup Take 5 mLs by mouth at bedtime as needed for cough. 10/17/19   Wallis Bamberg, PA-C  pseudoephedrine (SUDAFED) 30 MG tablet Take 1 tablet (30 mg total) by mouth every 8 (eight) hours as needed for congestion. 10/17/19   Wallis Bamberg, PA-C  tiZANidine (ZANAFLEX) 4 MG tablet Take 1 tablet (4 mg total) by mouth every 8 (eight) hours as needed. 07/17/20   Wallis Bamberg, PA-C    Family History Family History  Problem Relation Age of Onset   Diabetes Mother    Heart disease Mother    Healthy Father     Social History Social History   Tobacco Use   Smoking status: Every Day    Pack years: 0.00   Smokeless tobacco: Never  Vaping Use   Vaping Use: Never used  Substance Use Topics   Alcohol use: Yes   Drug use: No     Allergies   Patient has no known allergies.   Review of Systems Review of Systems  Constitutional:  Positive for activity change. Negative for appetite change, fatigue and fever.  HENT:  Positive for dental problem. Negative for congestion, facial swelling, sinus pressure, sneezing, sore throat, trouble swallowing and voice change.   Respiratory:  Negative for cough and shortness of breath.   Cardiovascular:  Negative for chest pain.  Gastrointestinal:  Negative for abdominal  pain, diarrhea, nausea and vomiting.  Neurological:  Negative for dizziness, light-headedness and headaches.    Physical Exam Triage Vital Signs ED Triage Vitals  Enc Vitals Group     BP 08/27/20 1925 124/80     Pulse Rate 08/27/20 1925 70     Resp 08/27/20 1925 18     Temp 08/27/20 1925 98.3 F (36.8 C)     Temp Source 08/27/20 1925 Oral     SpO2 08/27/20 1925 99 %     Weight --      Height --      Head Circumference --      Peak Flow --      Pain Score 08/27/20 1923 6     Pain Loc --      Pain Edu? --      Excl. in GC? --    No  data found.  Updated Vital Signs BP 124/80 (BP Location: Left Wrist)   Pulse 70   Temp 98.3 F (36.8 C) (Oral)   Resp 18   LMP 08/16/2020   SpO2 99%   Visual Acuity Right Eye Distance:   Left Eye Distance:   Bilateral Distance:    Right Eye Near:   Left Eye Near:    Bilateral Near:     Physical Exam Vitals reviewed.  Constitutional:      General: She is awake. She is not in acute distress.    Appearance: Normal appearance. She is not ill-appearing.     Comments: Very pleasant female appears stated age in no acute distress  HENT:     Head: Normocephalic and atraumatic.     Right Ear: Tympanic membrane, ear canal and external ear normal. Tympanic membrane is not erythematous or bulging.     Left Ear: Tympanic membrane, ear canal and external ear normal. Tympanic membrane is not erythematous or bulging.     Nose:     Right Sinus: No maxillary sinus tenderness or frontal sinus tenderness.     Left Sinus: No maxillary sinus tenderness or frontal sinus tenderness.     Mouth/Throat:     Dentition: Abnormal dentition. Dental tenderness and gingival swelling present.     Pharynx: Uvula midline. Posterior oropharyngeal erythema present. No oropharyngeal exudate.      Comments: No evidence of Ludwig angina Cardiovascular:     Rate and Rhythm: Normal rate and regular rhythm.     Heart sounds: Normal heart sounds, S1 normal and S2 normal. No murmur heard. Pulmonary:     Effort: Pulmonary effort is normal.     Breath sounds: Normal breath sounds. No wheezing, rhonchi or rales.     Comments: Clear to auscultation bilaterally Lymphadenopathy:     Head:     Right side of head: No submental, submandibular or tonsillar adenopathy.     Left side of head: No submental, submandibular or tonsillar adenopathy.     Cervical: No cervical adenopathy.  Psychiatric:        Behavior: Behavior is cooperative.     UC Treatments / Results  Labs (all labs ordered are listed, but only abnormal  results are displayed) Labs Reviewed - No data to display  EKG   Radiology No results found.  Procedures Procedures (including critical care time)  Medications Ordered in UC Medications - No data to display  Initial Impression / Assessment and Plan / UC Course  I have reviewed the triage vital signs and the nursing notes.  Pertinent labs & imaging results that were  available during my care of the patient were reviewed by me and considered in my medical decision making (see chart for details).      Dental infection noted on exam.  Patient started on Augmentin twice daily for 7 days.  She can alternate over-the-counter analgesics for pain relief.  Discussed that she will need to follow-up with a dentist as this will likely keep becoming reinfected until tooth is repaired.  She was given contact information for local dentist.  Discussed alarm symptoms that warrant emergent evaluation.  Strict return precautions given which patient expressed understanding.  Final Clinical Impressions(s) / UC Diagnoses   Final diagnoses:  Dental infection  Pain, dental     Discharge Instructions      Take Augmentin twice a day for 7 days to cover for infection.  Alternate Tylenol and ibuprofen for pain relief.  Follow-up with a dentist as you will likely continue to have symptoms until tooth is repaired.  If you have any trouble speaking, trouble swallowing, shortness of breath you need to go to emergency room.  If symptoms or not improving within 48 to 72 hours of starting medication please return for reevaluation.     ED Prescriptions     Medication Sig Dispense Auth. Provider   amoxicillin-clavulanate (AUGMENTIN) 875-125 MG tablet  (Status: Discontinued) Take 1 tablet by mouth every 12 (twelve) hours. 14 tablet Jirah Rider K, PA-C   amoxicillin-clavulanate (AUGMENTIN) 875-125 MG tablet Take 1 tablet by mouth every 12 (twelve) hours. 14 tablet Pratik Dalziel, Noberto Retort, PA-C      PDMP not reviewed  this encounter.   Jeani Hawking, PA-C 08/27/20 2053

## 2020-09-24 ENCOUNTER — Other Ambulatory Visit: Payer: Self-pay

## 2020-09-24 ENCOUNTER — Ambulatory Visit (HOSPITAL_COMMUNITY)
Admission: EM | Admit: 2020-09-24 | Discharge: 2020-09-24 | Disposition: A | Discharge status: Home / Self Care | Payer: Self-pay | Attending: Family Medicine | Admitting: Family Medicine

## 2020-09-24 ENCOUNTER — Encounter (HOSPITAL_COMMUNITY): Payer: Self-pay

## 2020-09-24 DIAGNOSIS — L02214 Cutaneous abscess of groin: Secondary | ICD-10-CM

## 2020-09-24 MED ORDER — HIBICLENS 4 % EX LIQD: Freq: Every day | CUTANEOUS | 0 refills | Status: DC | PRN

## 2020-09-24 MED ORDER — FLUCONAZOLE 150 MG PO TABS
150.0000 mg | ORAL_TABLET | Freq: Every day | ORAL | 0 refills | Status: DC
Start: 2020-09-24 — End: 2021-07-10

## 2020-09-24 MED ORDER — CEPHALEXIN 500 MG PO CAPS
500.0000 mg | ORAL_CAPSULE | Freq: Two times a day (BID) | ORAL | 0 refills | Status: DC
Start: 2020-09-24 — End: 2021-07-10

## 2020-09-24 NOTE — ED Triage Notes (Addendum)
Pt reports cyst in pubic area on left side which she first noticed about 3 days ago. Reports some white drainage has come out of it as well as some blood. States it is swollen and painful especially when wiping.   Pt reports was recently prescribed antibiotics for tooth but never picked them up due to price.

## 2020-09-24 NOTE — ED Provider Notes (Signed)
[facility]    CSN: [id number] Arrival date & time: 09/24/20  1925      History   Chief Complaint Chief Complaint  Patient presents with   Cyst    HPI Tiffany Stephens is a 25 y.o. female.   Patient presenting today with 3-day history of a bump that looks like a shape bump, up on her left groin fold region.  She states the area got larger and more painful and then busted and drained blood and thick pus.  She states she has not been trying anything for symptoms.  No past history of similar issues.    History reviewed. No pertinent past medical history.  Patient Active Problem List   Diagnosis Date Noted   Menorrhagia 07/29/2012    History reviewed. No pertinent surgical history.  OB History   No obstetric history on file.      Home Medications    Prior to Admission medications   Medication Sig Start Date End Date Taking? Authorizing Provider  cephALEXin (KEFLEX) 500 MG capsule Take 1 capsule (500 mg total) by mouth 2 (two) times daily. 09/24/20  Yes Particia Nearing, PA-C  chlorhexidine (HIBICLENS) 4 % external liquid Apply topically daily as needed. 09/24/20  Yes Particia Nearing, PA-C  fluconazole (DIFLUCAN) 150 MG tablet Take 1 tablet (150 mg total) by mouth daily. Take after antibiotics if having vaginal itching 09/24/20  Yes Particia Nearing, PA-C  amoxicillin-clavulanate (AUGMENTIN) 875-125 MG tablet Take 1 tablet by mouth every 12 (twelve) hours. 08/27/20   Raspet, Noberto Retort, PA-C  benzonatate (TESSALON) 100 MG capsule Take 1-2 capsules (100-200 mg total) by mouth 3 (three) times daily as needed. 10/17/19   Wallis Bamberg, PA-C  cetirizine (ZYRTEC ALLERGY) 10 MG tablet Take 1 tablet (10 mg total) by mouth daily. 10/17/19   Wallis Bamberg, PA-C  HYDROcodone-acetaminophen (NORCO/VICODIN) 5-325 MG tablet Take 1 tablet by mouth every 6 (six) hours as needed for moderate pain or severe pain. 05/18/19   Mardella Layman, MD  naproxen (NAPROSYN) 375 MG tablet Take  1 tablet (375 mg total) by mouth 2 (two) times daily with a meal. 07/17/20   Wallis Bamberg, PA-C  promethazine-dextromethorphan (PROMETHAZINE-DM) 6.25-15 MG/5ML syrup Take 5 mLs by mouth at bedtime as needed for cough. 10/17/19   Wallis Bamberg, PA-C  pseudoephedrine (SUDAFED) 30 MG tablet Take 1 tablet (30 mg total) by mouth every 8 (eight) hours as needed for congestion. 10/17/19   Wallis Bamberg, PA-C  tiZANidine (ZANAFLEX) 4 MG tablet Take 1 tablet (4 mg total) by mouth every 8 (eight) hours as needed. 07/17/20   Wallis Bamberg, PA-C    Family History Family History  Problem Relation Age of Onset   Diabetes Mother    Heart disease Mother    Healthy Father     Social History Social History   Tobacco Use   Smoking status: Every Day    Packs/day: 1.00    Pack years: 0.00    Types: Cigarettes   Smokeless tobacco: Never  Vaping Use   Vaping Use: Never used  Substance Use Topics   Alcohol use: Yes   Drug use: No     Allergies   Patient has no known allergies.   Review of Systems Review of Systems Per HPI  Physical Exam Triage Vital Signs ED Triage Vitals  Enc Vitals Group     BP 09/24/20 2031 (!) 144/91     Pulse Rate 09/24/20 2031 69     Resp  09/24/20 2031 18     Temp 09/24/20 2031 99 F (37.2 C)     Temp Source 09/24/20 2031 Oral     SpO2 09/24/20 2031 100 %     Weight --      Height --      Head Circumference --      Peak Flow --      Pain Score 09/24/20 2028 0     Pain Loc --      Pain Edu? --      Excl. in GC? --    No data found.  Updated Vital Signs BP (!) 144/91   Pulse 69   Temp 99 F (37.2 C) (Oral)   Resp 18   LMP 09/13/2020   SpO2 100%   Visual Acuity Right Eye Distance:   Left Eye Distance:   Bilateral Distance:    Right Eye Near:   Left Eye Near:    Bilateral Near:     Physical Exam Vitals and nursing note reviewed.  Constitutional:      Appearance: Normal appearance. She is not ill-appearing.  HENT:     Head: Atraumatic.  Eyes:      Extraocular Movements: Extraocular movements intact.     Conjunctiva/sclera: Conjunctivae normal.  Cardiovascular:     Rate and Rhythm: Normal rate and regular rhythm.     Heart sounds: Normal heart sounds.  Pulmonary:     Effort: Pulmonary effort is normal.     Breath sounds: Normal breath sounds.  Musculoskeletal:        General: Normal range of motion.     Cervical back: Normal range of motion and neck supple.  Skin:    General: Skin is warm and dry.     Comments: Small firm nodule left groin fold region, appears to be fully spontaneously drained.  No erythema, significant tenderness palpation  Neurological:     Mental Status: She is alert and oriented to person, place, and time.  Psychiatric:        Mood and Affect: Mood normal.        Thought Content: Thought content normal.        Judgment: Judgment normal.   UC Treatments / Results  Labs (all labs ordered are listed, but only abnormal results are displayed) Labs Reviewed - No data to display  EKG   Radiology No results found.  Procedures Procedures (including critical care time)  Medications Ordered in UC Medications - No data to display  Initial Impression / Assessment and Plan / UC Course  I have reviewed the triage vital signs and the nursing notes.  Pertinent labs & imaging results that were available during my care of the patient were reviewed by me and considered in my medical decision making (see chart for details).     Small groin fold abscess, spontaneously drained and appears to be trying to heal.  We will treat with Keflex, Hibiclens, good wound care as reviewed.  Diflucan tab sent in case getting a yeast infection from the antibiotic.  Follow-up if worsening or not resolving.  Final Clinical Impressions(s) / UC Diagnoses   Final diagnoses:  Groin abscess   Discharge Instructions   None    ED Prescriptions     Medication Sig Dispense Auth. Provider   cephALEXin (KEFLEX) 500 MG capsule Take  1 capsule (500 mg total) by mouth 2 (two) times daily. 14 capsule Particia Nearing, [place]   chlorhexidine (HIBICLENS) 4 % external liquid Apply topically daily  as needed. 120 mL Particia Nearing, PA-C   fluconazole (DIFLUCAN) 150 MG tablet Take 1 tablet (150 mg total) by mouth daily. Take after antibiotics if having vaginal itching 1 tablet Particia Nearing, [place]      PDMP not reviewed this encounter.   Particia Nearing, [place] 09/24/20 2133

## 2021-07-10 ENCOUNTER — Encounter (HOSPITAL_COMMUNITY): Payer: Self-pay

## 2021-07-10 ENCOUNTER — Ambulatory Visit (HOSPITAL_COMMUNITY)
Admission: EM | Admit: 2021-07-10 | Discharge: 2021-07-10 | Disposition: A | Discharge status: Home / Self Care | Payer: Self-pay | Attending: Nurse Practitioner | Admitting: Nurse Practitioner

## 2021-07-10 DIAGNOSIS — N3001 Acute cystitis with hematuria: Secondary | ICD-10-CM | POA: Insufficient documentation

## 2021-07-10 DIAGNOSIS — Z113 Encounter for screening for infections with a predominantly sexual mode of transmission: Secondary | ICD-10-CM | POA: Insufficient documentation

## 2021-07-10 LAB — POCT URINALYSIS DIPSTICK, ED / UC
Bilirubin Urine: NEGATIVE
Glucose, UA: NEGATIVE mg/dL
Nitrite: NEGATIVE
Protein, ur: 30 mg/dL — AB
Specific Gravity, Urine: 1.015 (ref 1.005–1.030)
Urobilinogen, UA: 0.2 mg/dL (ref 0.0–1.0)
pH: 7 (ref 5.0–8.0)

## 2021-07-10 MED ORDER — NITROFURANTOIN MONOHYD MACRO 100 MG PO CAPS: 100.0000 mg | ORAL_CAPSULE | Freq: Two times a day (BID) | ORAL | 0 refills | Status: DC

## 2021-07-10 NOTE — Discharge Instructions (Addendum)
-   Your symptoms are consistent with a urinary tract infection ?-The urinalysis today shows A little bit of white blood cells and blood ?-Please start on the Macrobid 100 mg twice daily for 5 days for a possible UTI ?-We will let you know if any of the vaginal swab testing comes back positive and will send in treatment if needed ?

## 2021-07-10 NOTE — ED Triage Notes (Signed)
2 day h/o dysuria and urinary frequency ?Denies urinary urgency. No meds taken. No vaginal itching or irritations. No known STD exposures. ?

## 2021-07-10 NOTE — ED Provider Notes (Signed)
?[facility] ? ? ? ?CSN: [id number] ?Arrival date & time: 07/10/21  1812 ? ? ?  ? ?History   ?Chief Complaint ?Chief Complaint  ?Patient presents with  ? Dysuria  ? ? ?HPI ?Tiffany Stephens is a 26 y.o. female.  ? ?Patient presents with 2-day history of dysuria, urinary frequency and urgency, voiding smaller amounts, suprapubic pressure.  She denies urinary incontinence, change in the odor of her urine, abdominal or back pain, flank pain, fever, nausea/vomiting.  She has not taken anything for symptoms. ? ?She is sexually active, denies any current vaginal discharge.  She is requesting STI testing today.  She is currently on her menses and started this a couple of days ago. ? ? ? ? ?History reviewed. No pertinent past medical history. ? ?Patient Active Problem List  ? Diagnosis Date Noted  ? Menorrhagia 07/29/2012  ? ? ?History reviewed. No pertinent surgical history. ? ?OB History   ?No obstetric history on file. ?  ? ? ? ?Home Medications   ? ?Prior to Admission medications   ?Medication Sig Start Date End Date Taking? Authorizing Provider  ?nitrofurantoin, macrocrystal-monohydrate, (MACROBID) 100 MG capsule Take 1 capsule (100 mg total) by mouth 2 (two) times daily. 07/10/21  Yes Valentino Nose, NP  ?cetirizine (ZYRTEC ALLERGY) 10 MG tablet Take 1 tablet (10 mg total) by mouth daily. 10/17/19   Wallis Bamberg, PA-C  ? ? ?Family History ?Family History  ?Problem Relation Age of Onset  ? Diabetes Mother   ? Heart disease Mother   ? Healthy Father   ? ? ?Social History ?Social History  ? ?Tobacco Use  ? Smoking status: Every Day  ?  Packs/day: 1.00  ?  Types: Cigarettes  ? Smokeless tobacco: Never  ?Vaping Use  ? Vaping Use: Never used  ?Substance Use Topics  ? Alcohol use: Yes  ? Drug use: No  ? ? ? ?Allergies   ?Patient has no known allergies. ? ? ?Review of Systems ?Review of Systems ?Per HPI ? ?Physical Exam ?Triage Vital Signs ?ED Triage Vitals [07/10/21 1859]  ?Enc Vitals Group  ?   BP 135/77  ?   Pulse  Rate 82  ?   Resp 17  ?   Temp (!) 97.4 ?F (36.3 ?C)  ?   Temp Source Oral  ?   SpO2 97 %  ?   Weight   ?   Height   ?   Head Circumference   ?   Peak Flow   ?   Pain Score 0  ?   Pain Loc   ?   Pain Edu?   ?   Excl. in GC?   ? ?No data found. ? ?Updated Vital Signs ?BP 135/77 (BP Location: Left Arm)   Pulse 82   Temp (!) 97.4 ?F (36.3 ?C) (Oral)   Resp 17   LMP 07/06/2021 (Approximate)   SpO2 97%  ? ?Visual Acuity ?Right Eye Distance:   ?Left Eye Distance:   ?Bilateral Distance:   ? ?Right Eye Near:   ?Left Eye Near:    ?Bilateral Near:    ? ?Physical Exam ?Vitals and nursing note reviewed.  ?Constitutional:   ?   General: She is not in acute distress. ?   Appearance: Normal appearance. She is not toxic-appearing.  ?HENT:  ?   Head: Normocephalic and atraumatic.  ?Pulmonary:  ?   Effort: Pulmonary effort is normal. No respiratory distress.  ?Abdominal:  ?   General:  Abdomen is flat. Bowel sounds are normal. There is no distension.  ?   Palpations: Abdomen is soft. There is no mass.  ?   Tenderness: There is no abdominal tenderness. There is no right CVA tenderness, left CVA tenderness or guarding.  ?Genitourinary: ?   Comments: Deferred ?Skin: ?   General: Skin is warm and dry.  ?   Coloration: Skin is not jaundiced or pale.  ?   Findings: No erythema.  ?Neurological:  ?   Mental Status: She is alert and oriented to person, place, and time.  ?Psychiatric:     ?   Behavior: Behavior is cooperative.  ? ? ? ?UC Treatments / Results  ?Labs ?(all labs ordered are listed, but only abnormal results are displayed) ?Labs Reviewed  ?POCT URINALYSIS DIPSTICK, ED / UC - Abnormal; Notable for the following components:  ?    Result Value  ? Ketones, ur TRACE (*)   ? Hgb urine dipstick LARGE (*)   ? Protein, ur 30 (*)   ? Leukocytes,Ua SMALL (*)   ? All other components within normal limits  ?URINE CULTURE  ?CERVICOVAGINAL ANCILLARY ONLY  ? ? ?EKG ? ? ?Radiology ?No results found. ? ?Procedures ?Procedures (including  critical care time) ? ?Medications Ordered in UC ?Medications - No data to display ? ?Initial Impression / Assessment and Plan / UC Course  ?I have reviewed the triage vital signs and the nursing notes. ? ?Pertinent labs & imaging results that were available during my care of the patient were reviewed by me and considered in my medical decision making (see chart for details). ? ?  ?UA today shows trace ketones, large hemoglobin, leukocytes.  Given symptoms, will treat with Macrobid 100 mg twice daily for 5 days.  Urine for culture in the meantime.  We will send self swab for testing for gonorrhea, chlamydia, trichomonas.  Patient declines HIV and syphilis testing today.  Encourage plenty of hydration with water, avoiding bladder stimulants like caffeine.  Seek care if symptoms worsen like if she develops fever, new back pain that is severe, nausea/vomiting.  The patient was given the opportunity to ask questions.  All questions answered to their satisfaction.  The patient is in agreement to this plan.  ? ?Final Clinical Impressions(s) / UC Diagnoses  ? ?Final diagnoses:  ?Acute cystitis with hematuria  ?Routine screening for STI (sexually transmitted infection)  ? ? ? ?Discharge Instructions   ? ?  ?- Your symptoms are consistent with a urinary tract infection ?-The urinalysis today shows A little bit of white blood cells and blood ?-Please start on the Macrobid 100 mg twice daily for 5 days for a possible UTI ?-We will let you know if any of the vaginal swab testing comes back positive and will send in treatment if needed ? ? ? ?ED Prescriptions   ? ? Medication Sig Dispense Auth. Provider  ? nitrofurantoin, macrocrystal-monohydrate, (MACROBID) 100 MG capsule Take 1 capsule (100 mg total) by mouth 2 (two) times daily. 10 capsule Valentino Nose, NP  ? ?  ? ?PDMP not reviewed this encounter. ?  ?Valentino Nose, NP ?07/10/21 1940 ? ?

## 2021-07-11 LAB — CERVICOVAGINAL ANCILLARY ONLY
Bacterial Vaginitis (gardnerella): POSITIVE — AB
Candida Glabrata: NEGATIVE
Candida Vaginitis: NEGATIVE
Chlamydia: NEGATIVE
Comment: NEGATIVE
Comment: NEGATIVE
Comment: NEGATIVE
Comment: NEGATIVE
Comment: NEGATIVE
Comment: NORMAL
Neisseria Gonorrhea: NEGATIVE
Trichomonas: NEGATIVE

## 2021-07-13 LAB — URINE CULTURE: Culture: >=100000 — AB

## 2021-12-22 ENCOUNTER — Emergency Department (HOSPITAL_COMMUNITY)
Admission: EM | Admit: 2021-12-22 | Discharge: 2021-12-22 | Disposition: A | Discharge status: Home / Self Care | Payer: Self-pay | Attending: Student | Admitting: Student

## 2021-12-22 ENCOUNTER — Other Ambulatory Visit: Payer: Self-pay

## 2021-12-22 DIAGNOSIS — K0889 Other specified disorders of teeth and supporting structures: Secondary | ICD-10-CM | POA: Insufficient documentation

## 2021-12-22 MED ORDER — PENICILLIN V POTASSIUM 500 MG PO TABS: 500.0000 mg | ORAL_TABLET | Freq: Four times a day (QID) | ORAL | 0 refills | Status: AC

## 2021-12-22 MED ORDER — IBUPROFEN 800 MG PO TABS: 800.0000 mg | ORAL_TABLET | Freq: Two times a day (BID) | ORAL | 0 refills | Status: AC

## 2021-12-22 MED ORDER — OXYCODONE-ACETAMINOPHEN 5-325 MG PO TABS
1.0000 | ORAL_TABLET | Freq: Once | ORAL | Status: AC
  Administered 2021-12-22: 1 via ORAL
  Filled 2021-12-22: qty 1

## 2021-12-22 NOTE — ED Provider Notes (Signed)
[facility] EMERGENCY DEPARTMENT Provider Note   CSN: [id number] Arrival date & time: 12/22/21  2054     History  Chief Complaint  Patient presents with   Dental Pain    Tiffany Stephens is a 26 y.o. female.  HPI   Without significant medical history presents with complaints of dental pain.  Pain started out 2 days ago, to her left bottom tooth, states that she has not seen dentist in a long time, states that she has a big hole in it and likely cause of her pain.  She endorses worsening pain with chewing, pain with eating or drinking, denies any tongue throat lip swelling difficulty breathing no fevers no chills, not immunocompromise.  No trauma associated with with this pain.  Home Medications Prior to Admission medications   Medication Sig Start Date End Date Taking? Authorizing Provider  ibuprofen (ADVIL) 800 MG tablet Take 1 tablet (800 mg total) by mouth 2 (two) times daily for 7 days. 12/22/21 12/29/21 Yes Carroll Sage, PA-C  penicillin v potassium (VEETID) 500 MG tablet Take 1 tablet (500 mg total) by mouth 4 (four) times daily for 7 days. 12/22/21 12/29/21 Yes Carroll Sage, PA-C  cetirizine (ZYRTEC ALLERGY) 10 MG tablet Take 1 tablet (10 mg total) by mouth daily. 10/17/19   Wallis Bamberg, PA-C  nitrofurantoin, macrocrystal-monohydrate, (MACROBID) 100 MG capsule Take 1 capsule (100 mg total) by mouth 2 (two) times daily. 07/10/21   Valentino Nose, NP      Allergies    Patient has no known allergies.    Review of Systems   Review of Systems  Constitutional:  Negative for chills and fever.  HENT:  Positive for dental problem.   Respiratory:  Negative for shortness of breath.   Cardiovascular:  Negative for chest pain.  Gastrointestinal:  Negative for abdominal pain.  Neurological:  Negative for headaches.    Physical Exam Updated Vital Signs BP (!) 148/90   Pulse 68   Temp 98.4 F (36.9 C) (Oral)   Resp 18   SpO2 100%  Physical  Exam Vitals and nursing note reviewed.  Constitutional:      General: She is not in acute distress.    Appearance: She is not ill-appearing.  HENT:     Head: Normocephalic and atraumatic.     Nose: No congestion.     Mouth/Throat:     Mouth: Mucous membranes are moist.     Pharynx: Oropharynx is clear. No oropharyngeal exudate or posterior oropharyngeal erythema.     Comments: No trismus no torticollis no oral edema present, tongue uvula midline controlling her secretions tonsils both equal symmetric bilaterally, no submandibular swelling.  Patient poor dental hygiene, had significant erosion in the left bottom molar, no evidence of gingivitis no palpable fluctuance induration no submandibular swelling no muffled tone voice. Eyes:     Extraocular Movements: Extraocular movements intact.     Conjunctiva/sclera: Conjunctivae normal.     Comments: No pain with eye movement no facial swelling no periorbital swelling  Cardiovascular:     Rate and Rhythm: Normal rate and regular rhythm.     Pulses: Normal pulses.     Heart sounds: No murmur heard.    No friction rub. No gallop.  Pulmonary:     Effort: Pulmonary effort is normal.  Skin:    General: Skin is warm and dry.  Neurological:     Mental Status: She is alert.  Psychiatric:  Mood and Affect: Mood normal.     ED Results / Procedures / Treatments   Labs (all labs ordered are listed, but only abnormal results are displayed) Labs Reviewed - No data to display  EKG None  Radiology No results found.  Procedures Procedures    Medications Ordered in ED Medications  oxyCODONE-acetaminophen (PERCOCET/ROXICET) 5-325 MG per tablet 1 tablet (1 tablet Oral Given 12/22/21 2356)    ED Course/ Medical Decision Making/ A&P                           Medical Decision Making Risk Prescription drug management.   This patient presents to the ED for concern of dental pain, this involves an extensive number of treatment  options, and is a complaint that carries with it a high risk of complications and morbidity.  The differential diagnosis includes Ludwig angina, retropharyngeal peritonsillar abscess    Additional history obtained:  Additional history obtained from N/A External records from outside source obtained and reviewed including N/A   Co morbidities that complicate the patient evaluation  N/A  Social Determinants of Health:  No primary care provider    Lab Tests:  I Ordered, and personally interpreted labs.  The pertinent results include: n/a   Imaging Studies ordered:  I ordered imaging studies including N/A I independently visualized and interpreted imaging which showed n/a I agree with the radiologist interpretation   Cardiac Monitoring:  The patient was maintained on a cardiac monitor.  I personally viewed and interpreted the cardiac monitored which showed an underlying rhythm of: n/a   Medicines ordered and prescription drug management:  I ordered medication including oxycodone I have reviewed the patients home medicines and have made adjustments as needed  Critical Interventions:  N/A   Reevaluation:  Benign physical exam agreement with plan and discharge  Consultations Obtained:  N/a    Test Considered:  CT maxillofacial-deferred my suspicion for facial abscess/orbital/periorbital abscess is low at this time no evidence of facial infection present during my examination.    Rule out I have low suspicion for peritonsillar abscess, retropharyngeal abscess, or Ludwig angina as oropharynx was visualized tongue and uvula were both midline, there is no exudates, erythema or edema noted in the posterior pillars or on/ around tonsils.  Low suspicion for an abscess as gumline were palpated no fluctuance or induration felt.  Low suspicion for periorbital or orbital cellulitis as patient face had no erythematous, patient EOMs were intact, he had no pain with eye  movement.     Dispostion and problem list  After consideration of the diagnostic results and the patients response to treatment, I feel that the patent would benefit from discharge.  Dental pain-likely dental cavity will likely need tooth extraction will start on antibiotics provide with some pain medication follow-up with dentist for further evaluation            Final Clinical Impression(s) / ED Diagnoses Final diagnoses:  Pain, dental    Rx / DC Orders ED Discharge Orders          Ordered    penicillin v potassium (VEETID) 500 MG tablet  4 times daily        12/22/21 2340    ibuprofen (ADVIL) 800 MG tablet  2 times daily        12/22/21 2341              Carroll Sage, PA-C 12/22/21 2358  Merryl Hacker, MD 12/23/21 [phone]

## 2021-12-22 NOTE — Discharge Instructions (Signed)
You have a dental infection causing your pain, have started you on antibiotics please take as prescribed.  Also given you ibuprofen please take as prescribed.  Please call the dentist that I have referred to you, also given you resources for dentist within the area.  Come back to the emergency department if you develop chest pain, shortness of breath, severe abdominal pain, uncontrolled nausea, vomiting, diarrhea.

## 2021-12-22 NOTE — ED Notes (Signed)
Patient educated about not driving or performing other critical tasks (such as operating heavy machinery, caring for infant/toddler/child) due to sedative nature of narcotic medications received while in the ED.  Pt/caregiver verbalized understanding.   

## 2022-02-02 ENCOUNTER — Encounter (HOSPITAL_COMMUNITY): Payer: Self-pay

## 2022-02-02 ENCOUNTER — Ambulatory Visit (HOSPITAL_COMMUNITY)
Admission: EM | Admit: 2022-02-02 | Discharge: 2022-02-02 | Disposition: A | Discharge status: Home / Self Care | Payer: Self-pay | Attending: Family Medicine | Admitting: Family Medicine

## 2022-02-02 DIAGNOSIS — K0889 Other specified disorders of teeth and supporting structures: Secondary | ICD-10-CM

## 2022-02-02 MED ORDER — KETOROLAC TROMETHAMINE 60 MG/2ML IM SOLN
INTRAMUSCULAR | Status: AC
  Filled 2022-02-02: qty 2

## 2022-02-02 MED ORDER — CLINDAMYCIN HCL 300 MG PO CAPS: 300.0000 mg | ORAL_CAPSULE | Freq: Three times a day (TID) | ORAL | 0 refills | Status: DC

## 2022-02-02 MED ORDER — KETOROLAC TROMETHAMINE 30 MG/ML IJ SOLN
60.0000 mg | Freq: Once | INTRAMUSCULAR | Status: AC
  Administered 2022-02-02: 60 mg via INTRAMUSCULAR

## 2022-02-02 MED ORDER — IBUPROFEN 800 MG PO TABS: 800.0000 mg | ORAL_TABLET | Freq: Three times a day (TID) | ORAL | 0 refills | Status: DC

## 2022-02-02 NOTE — Discharge Instructions (Signed)
Meds ordered this encounter  Medications   ketorolac (TORADOL) 30 MG/ML injection 60 mg   clindamycin (CLEOCIN) 300 MG capsule    Sig: Take 1 capsule (300 mg total) by mouth 3 (three) times daily.    Dispense:  30 capsule    Refill:  0   ibuprofen (ADVIL) 800 MG tablet    Sig: Take 1 tablet (800 mg total) by mouth 3 (three) times daily with meals.    Dispense:  21 tablet    Refill:  0

## 2022-02-02 NOTE — ED Triage Notes (Signed)
Pt is here for dental pain causing some swelling and left ear pain x3days

## 2022-02-04 NOTE — ED Provider Notes (Signed)
  [facility] CARE CENTER   [id number] 02/02/22 Arrival Time: 7829  ASSESSMENT & PLAN:  1. Pain, dental    No sign of abscess requiring I&D at this time. Discussed.  Meds ordered this encounter  Medications   ketorolac (TORADOL) 30 MG/ML injection 60 mg   clindamycin (CLEOCIN) 300 MG capsule    Sig: Take 1 capsule (300 mg total) by mouth 3 (three) times daily.    Dispense:  30 capsule    Refill:  0   ibuprofen (ADVIL) 800 MG tablet    Sig: Take 1 tablet (800 mg total) by mouth 3 (three) times daily with meals.    Dispense:  21 tablet    Refill:  0     Dental resource written instructions given. She will schedule dental evaluation as soon as possible if not improving over the next 24-48 hours.  Reviewed expectations re: course of current medical issues. Questions answered. Outlined signs and symptoms indicating need for more acute intervention. Patient verbalized understanding. After Visit Summary given.   SUBJECTIVE:  Tiffany Stephens is a 26 y.o. female who reports gradual onset of left upper dental pain described as aching. Present for few days days. Fever: absent. Tolerating PO intake but reports pain with chewing. Normal swallowing. She does not see a dentist regularly. No neck swelling or pain. OTC analgesics without relief.   OBJECTIVE: Vitals:   02/02/22 1154  BP: (!) 168/98  Pulse: 60  Resp: 16  Temp: 98.3 F (36.8 C)  TempSrc: Oral  SpO2: 98%    General appearance: alert; no distress HENT: normocephalic; atraumatic; dentition: fair; left upper gum without areas of fluctuance, drainage, or bleeding and with tenderness to palpation; normal jaw movement without difficulty Neck: supple without LAD; FROM; trachea midline Lungs: normal respirations; unlabored; speaks full sentences without difficulty Skin: warm and dry Psychological: alert and cooperative; normal mood and affect  No Known Allergies  History reviewed. No pertinent past medical history. Social  History   Socioeconomic History   Marital status: Single    Spouse name: Not on file   Number of children: Not on file   Years of education: Not on file   Highest education level: Not on file  Occupational History   Not on file  Tobacco Use   Smoking status: Every Day    Packs/day: 1.00    Types: Cigarettes   Smokeless tobacco: Never  Vaping Use   Vaping Use: Never used  Substance and Sexual Activity   Alcohol use: Yes   Drug use: No   Sexual activity: Not Currently    Birth control/protection: None  Other Topics Concern   Not on file  Social History Narrative   Not on file   Social Determinants of Health   Financial Resource Strain: Not on file  Food Insecurity: Not on file  Transportation Needs: Not on file  Physical Activity: Not on file  Stress: Not on file  Social Connections: Not on file  Intimate Partner Violence: Not on file   Family History  Problem Relation Age of Onset   Diabetes Mother    Heart disease Mother    Healthy Father    History reviewed. No pertinent surgical history.    Mardella Layman, MD 02/04/22 [phone]

## 2022-02-13 ENCOUNTER — Other Ambulatory Visit: Payer: Self-pay

## 2022-02-13 ENCOUNTER — Emergency Department (HOSPITAL_COMMUNITY)
Admission: EM | Admit: 2022-02-13 | Discharge: 2022-02-14 | Disposition: A | Discharge status: Home / Self Care | Payer: Self-pay | Attending: Emergency Medicine | Admitting: Emergency Medicine

## 2022-02-13 ENCOUNTER — Emergency Department (HOSPITAL_COMMUNITY): Payer: Self-pay

## 2022-02-13 DIAGNOSIS — F17200 Nicotine dependence, unspecified, uncomplicated: Secondary | ICD-10-CM | POA: Insufficient documentation

## 2022-02-13 DIAGNOSIS — J101 Influenza due to other identified influenza virus with other respiratory manifestations: Secondary | ICD-10-CM | POA: Insufficient documentation

## 2022-02-13 DIAGNOSIS — Z20822 Contact with and (suspected) exposure to covid-19: Secondary | ICD-10-CM | POA: Insufficient documentation

## 2022-02-13 DIAGNOSIS — N9489 Other specified conditions associated with female genital organs and menstrual cycle: Secondary | ICD-10-CM | POA: Insufficient documentation

## 2022-02-13 LAB — CBC WITH DIFFERENTIAL/PLATELET
Abs Immature Granulocytes: 0.01 10*3/uL (ref 0.00–0.07)
Basophils Absolute: 0 10*3/uL (ref 0.0–0.1)
Basophils Relative: 0 %
Eosinophils Absolute: 0.2 10*3/uL (ref 0.0–0.5)
Eosinophils Relative: 3 %
HCT: 38.7 % (ref 36.0–46.0)
Hemoglobin: 11.5 g/dL — ABNORMAL LOW (ref 12.0–15.0)
Immature Granulocytes: 0 %
Lymphocytes Relative: 20 %
Lymphs Abs: 1.3 10*3/uL (ref 0.7–4.0)
MCH: 22.2 pg — ABNORMAL LOW (ref 26.0–34.0)
MCHC: 29.7 g/dL — ABNORMAL LOW (ref 30.0–36.0)
MCV: 74.6 fL — ABNORMAL LOW (ref 80.0–100.0)
Monocytes Absolute: 0.5 10*3/uL (ref 0.1–1.0)
Monocytes Relative: 8 %
Neutro Abs: 4.4 10*3/uL (ref 1.7–7.7)
Neutrophils Relative %: 69 %
Platelets: 326 10*3/uL (ref 150–400)
RBC: 5.19 MIL/uL — ABNORMAL HIGH (ref 3.87–5.11)
RDW: 14.8 % (ref 11.5–15.5)
WBC: 6.4 10*3/uL (ref 4.0–10.5)
nRBC: 0 % (ref 0.0–0.2)

## 2022-02-13 MED ORDER — ACETAMINOPHEN 500 MG PO TABS
1000.0000 mg | ORAL_TABLET | Freq: Once | ORAL | Status: AC
  Administered 2022-02-14: 1000 mg via ORAL
  Filled 2022-02-13: qty 2

## 2022-02-13 NOTE — ED Triage Notes (Signed)
Pt with CP x 2 days and dry cough x 4 days. Does endorse some chills/sweats at home.

## 2022-02-13 NOTE — ED Provider Triage Note (Signed)
Emergency Medicine Provider Triage Evaluation Note  Tiffany Stephens , a 26 y.o. female  was evaluated in triage.  Pt complains of cough, chest pain, abdominal pain.  States she has been sick for several days.  Denies known sick contacts. No fevers.  Chest pain worse with coughing.  Review of Systems  Positive: Chest pain, cough, abdominal pain Negative: fever  Physical Exam  BP (!) 153/98   Pulse (!) 108   Temp 100.2 F (37.9 C) (Oral)   Resp 15   LMP  (LMP Unknown)   SpO2 99%   Gen:   Awake, no distress   Resp:  Normal effort, dry cough on exam MSK:   Moves extremities without difficulty  Other:  + nasal congestion  Medical Decision Making  Medically screening exam initiated at 10:43 PM.  Appropriate orders placed.  Tiffany Stephens was informed that the remainder of the evaluation will be completed by another provider, this initial triage assessment does not replace that evaluation, and the importance of remaining in the ED until their evaluation is complete.  Chest pain, abdominal pain, cough.  No sick contacts.  Febrile in triage but non-toxic appearing.  EKG, labs, CXR, covid/flu screen.  Tylenol ordered for fever.   Garlon Hatchet, PA-C 02/13/22 2245

## 2022-02-14 LAB — RESP PANEL BY RT-PCR (FLU A&B, COVID) ARPGX2
Influenza A by PCR: NEGATIVE
Influenza B by PCR: POSITIVE — AB
SARS Coronavirus 2 by RT PCR: NEGATIVE

## 2022-02-14 LAB — COMPREHENSIVE METABOLIC PANEL
ALT: 28 U/L (ref 0–44)
AST: 26 U/L (ref 15–41)
Albumin: 3.4 g/dL — ABNORMAL LOW (ref 3.5–5.0)
Alkaline Phosphatase: 58 U/L (ref 38–126)
Anion gap: 7 (ref 5–15)
BUN: 11 mg/dL (ref 6–20)
CO2: 23 mmol/L (ref 22–32)
Calcium: 8.7 mg/dL — ABNORMAL LOW (ref 8.9–10.3)
Chloride: 108 mmol/L (ref 98–111)
Creatinine, Ser: 0.86 mg/dL (ref 0.44–1.00)
GFR, Estimated: >60 mL/min (ref >60)
Glucose, Bld: 120 mg/dL — ABNORMAL HIGH (ref 70–99)
Potassium: 3.5 mmol/L (ref 3.5–5.1)
Sodium: 138 mmol/L (ref 135–145)
Total Bilirubin: <0.1 mg/dL — ABNORMAL LOW (ref 0.3–1.2)
Total Protein: 7.1 g/dL (ref 6.5–8.1)

## 2022-02-14 LAB — LIPASE, BLOOD: Lipase: 32 U/L (ref 11–51)

## 2022-02-14 LAB — TROPONIN I (HIGH SENSITIVITY)
Troponin I (High Sensitivity): 6 ng/L (ref <18)
Troponin I (High Sensitivity): 6 ng/L (ref <18)

## 2022-02-14 LAB — I-STAT BETA HCG BLOOD, ED (MC, WL, AP ONLY): I-stat hCG, quantitative: <5 m[IU]/mL (ref <5)

## 2022-02-14 NOTE — ED Provider Notes (Signed)
[facility] EMERGENCY DEPARTMENT Provider Note   CSN: [id number] Arrival date & time: 02/13/22  2201     History  Chief Complaint  Patient presents with   Chest Pain    Tiffany Stephens is a 26 y.o. female.   Chest Pain Patient reports that she has had cough for up to a week.  Over the past 2 days she has had increasing fever, body aches, chest pain with cough and generalized weakness.  She reports diarrhea.  She is a current smoker. No hemoptysis is reported She reports sore throat     Home Medications Prior to Admission medications   Medication Sig Start Date End Date Taking? Authorizing Provider  cetirizine (ZYRTEC ALLERGY) 10 MG tablet Take 1 tablet (10 mg total) by mouth daily. 10/17/19   Wallis Bamberg, PA-C  clindamycin (CLEOCIN) 300 MG capsule Take 1 capsule (300 mg total) by mouth 3 (three) times daily. 02/02/22   Mardella Layman, MD  ibuprofen (ADVIL) 800 MG tablet Take 1 tablet (800 mg total) by mouth 3 (three) times daily with meals. 02/02/22   Mardella Layman, MD  nitrofurantoin, macrocrystal-monohydrate, (MACROBID) 100 MG capsule Take 1 capsule (100 mg total) by mouth 2 (two) times daily. 07/10/21   Valentino Nose, NP      Allergies    Patient has no known allergies.    Review of Systems   Review of Systems  Cardiovascular:  Positive for chest pain.    Physical Exam Updated Vital Signs BP (!) 169/98   Pulse 78   Temp 100.2 F (37.9 C) (Oral)   Resp (!) 21   LMP  (LMP Unknown)   SpO2 98%  Physical Exam CONSTITUTIONAL: Well developed/well nourished, no distress HEAD: Normocephalic/atraumatic EYES: EOMI/PERRL ENMT: Mucous membranes moist, no drooling or stridor NECK: supple no meningeal signs CV: S1/S2 noted, no murmurs/rubs/gallops noted LUNGS: Lungs are clear to auscultation bilaterally, no apparent distress ABDOMEN: soft, nontender, obese NEURO: Pt is awake/alert/appropriate, moves all extremitiesx4.  No facial droop.   SKIN: warm,  color normal PSYCH: no abnormalities of mood noted, alert and oriented to situation  ED Results / Procedures / Treatments   Labs (all labs ordered are listed, but only abnormal results are displayed) Labs Reviewed  RESP PANEL BY RT-PCR (FLU A&B, COVID) ARPGX2 - Abnormal; Notable for the following components:      Result Value   Influenza B by PCR POSITIVE (*)    All other components within normal limits  CBC WITH DIFFERENTIAL/PLATELET - Abnormal; Notable for the following components:   RBC 5.19 (*)    Hemoglobin 11.5 (*)    MCV 74.6 (*)    MCH 22.2 (*)    MCHC 29.7 (*)    All other components within normal limits  COMPREHENSIVE METABOLIC PANEL - Abnormal; Notable for the following components:   Glucose, Bld 120 (*)    Calcium 8.7 (*)    Albumin 3.4 (*)    Total Bilirubin <0.1 (*)    All other components within normal limits  LIPASE, BLOOD  I-STAT BETA HCG BLOOD, ED ([facility], [facility], [facility] ONLY)  TROPONIN I (HIGH SENSITIVITY)  TROPONIN I (HIGH SENSITIVITY)    EKG EKG Interpretation  Date/Time:  Friday February 13 2022 22:29:01 EST Ventricular Rate:  112 PR Interval:  146 QRS Duration: 84 QT Interval:  330 QTC Calculation: 450 R Axis:   92 Text Interpretation: Sinus tachycardia Rightward axis Abnormal ECG No previous ECGs available Confirmed by Zadie Rhine ([id number]) on  02/14/2022 2:06:15 AM  Radiology DG Chest 2 View  Result Date: 02/13/2022 CLINICAL DATA:  Chest pain and cough EXAM: CHEST - 2 VIEW COMPARISON:  None Available. FINDINGS: The heart size and mediastinal contours are within normal limits. Both lungs are clear. The visualized skeletal structures are unremarkable. IMPRESSION: No active cardiopulmonary disease. Electronically Signed   By: Darliss Cheney M.D.   On: 02/13/2022 23:57    Procedures Procedures    Medications Ordered in ED Medications  acetaminophen (TYLENOL) tablet 1,000 mg (1,000 mg Oral Given 02/14/22 0134)    ED Course/ Medical Decision Making/ A&P                            Medical Decision Making  This patient presents to the ED for concern of chest pain, this involves an extensive number of treatment options, and is a complaint that carries with it a high risk of complications and morbidity.  The differential diagnosis includes but is not limited to acute coronary syndrome, aortic dissection, pulmonary embolism, pericarditis, pneumothorax, pneumonia, myocarditis, pleurisy, esophageal rupture    Comorbidities that complicate the patient evaluation: Patient's presentation is complicated by their history of obesity  Social Determinants of Health: Patient's  tobacco use   increases the complexity of managing their presentation   Lab Tests: I Ordered, and personally interpreted labs.  The pertinent results include: Positive for influenza  Imaging Studies ordered: I ordered imaging studies including X-ray chest   I independently visualized and interpreted imaging which showed no acute findings I agree with the radiologist interpretation   Medicines ordered and prescription drug management: I ordered medication including Tylenol for fever Reevaluation of the patient after these medicines showed that the patient    improved  Test Considered: Patient well-appearing, no distress, no added lung sounds, no hypoxia.  She is safe for outpatient management. Will defer Tamiflu for now  Reevaluation: After the interventions noted above, I reevaluated the patient and found that they have :improved  Complexity of problems addressed: Patient's presentation is most consistent with  acute presentation with potential threat to life or bodily function  Disposition: After consideration of the diagnostic results and the patient's response to treatment,  I feel that the patent would benefit from discharge   .           Final Clinical Impression(s) / ED Diagnoses Final diagnoses:  Influenza B    Rx / DC Orders ED Discharge Orders      None         Zadie Rhine, MD 02/14/22 0300

## 2023-09-25 ENCOUNTER — Encounter (HOSPITAL_COMMUNITY): Payer: Self-pay

## 2023-09-25 ENCOUNTER — Ambulatory Visit (HOSPITAL_COMMUNITY)
Admission: EM | Admit: 2023-09-25 | Discharge: 2023-09-25 | Disposition: A | Discharge status: Home / Self Care | Payer: Self-pay | Attending: Family Medicine | Admitting: Family Medicine

## 2023-09-25 DIAGNOSIS — R220 Localized swelling, mass and lump, head: Secondary | ICD-10-CM

## 2023-09-25 MED ORDER — IBUPROFEN 800 MG PO TABS: 800.0000 mg | ORAL_TABLET | Freq: Three times a day (TID) | ORAL | 0 refills | Status: DC

## 2023-09-25 MED ORDER — CLINDAMYCIN HCL 300 MG PO CAPS: 300.0000 mg | ORAL_CAPSULE | Freq: Three times a day (TID) | ORAL | 0 refills | Status: DC

## 2023-09-25 NOTE — ED Triage Notes (Signed)
 Patient c/o left lower dental pain and facial swelling x 2 days.  Patient has not had any medication for pain.

## 2023-09-29 NOTE — ED Provider Notes (Signed)
  [facility]ARE CENTER   [id number] 09/25/23 Arrival Time: 1735  ASSESSMENT & PLAN:  1. Facial swelling    No sign of abscess requiring I&D at this time. Discussed. Begin: Meds ordered this encounter  Medications   clindamycin  (CLEOCIN ) 300 MG capsule    Sig: Take 1 capsule (300 mg total) by mouth 3 (three) times daily.    Dispense:  30 capsule    Refill:  0   ibuprofen  (ADVIL ) 800 MG tablet    Sig: Take 1 tablet (800 mg total) by mouth 3 (three) times daily with meals.    Dispense:  21 tablet    Refill:  0    Follow-up Information     [facility].   Specialty: Emergency Medicine Why: If worsening or failing to improve as anticipated. Contact information: [address] [place]  [phone]                Reviewed expectations re: course of current medical issues. Questions answered. Outlined signs and symptoms indicating need for more acute intervention. Patient verbalized understanding. After Visit Summary given.   SUBJECTIVE:  Tiffany Stephens is a 28 y.o. female who reports left lower dental pain and facial swelling x 2 days. H/O same before. Cannot afford dental care. Denies fever. Tolerating PO. No tx PTA.  OBJECTIVE: Vitals:   09/25/23 1748  BP: 118/82  Pulse: 73  Resp: 16  Temp: 98.3 F (36.8 C)  TempSrc: Oral  SpO2: 95%    General appearance: alert; no distress HENT: normocephalic; atraumatic; dentition: fair; left lower rear gum without areas of fluctuance, drainage, or bleeding and with tenderness to palpation; normal jaw movement without difficulty Neck: supple without LAD; FROM; trachea midline Lungs: normal respirations; unlabored; speaks full sentences without difficulty Skin: warm and dry Psychological: alert and cooperative; normal mood and affect  No Known Allergies  History reviewed. No pertinent past medical history. Social History   Socioeconomic  History   Marital status: Single    Spouse name: Not on file   Number of children: Not on file   Years of education: Not on file   Highest education level: Not on file  Occupational History   Not on file  Tobacco Use   Smoking status: Every Day    Current packs/day: 1.00    Types: Cigarettes   Smokeless tobacco: Never  Vaping Use   Vaping status: Never Used  Substance and Sexual Activity   Alcohol use: Yes   Drug use: No   Sexual activity: Not Currently    Birth control/protection: None  Other Topics Concern   Not on file  Social History Narrative   Not on file   Social Drivers of Health   Financial Resource Strain: Not on file  Food Insecurity: Not on file  Transportation Needs: Not on file  Physical Activity: Not on file  Stress: Not on file  Social Connections: Not on file  Intimate Partner Violence: Not on file   Family History  Problem Relation Age of Onset   Diabetes Mother    Heart disease Mother    Healthy Father    History reviewed. No pertinent surgical history.    Rolinda Rogue, MD 09/29/23 [phone]

## 2023-10-05 ENCOUNTER — Ambulatory Visit (HOSPITAL_COMMUNITY)
Admission: EM | Admit: 2023-10-05 | Discharge: 2023-10-05 | Disposition: A | Discharge status: Home / Self Care | Payer: Self-pay

## 2023-10-05 ENCOUNTER — Other Ambulatory Visit: Payer: Self-pay

## 2023-10-05 ENCOUNTER — Encounter (HOSPITAL_COMMUNITY): Payer: Self-pay

## 2023-10-05 ENCOUNTER — Encounter (HOSPITAL_COMMUNITY): Payer: Self-pay | Admitting: Emergency Medicine

## 2023-10-05 ENCOUNTER — Emergency Department (HOSPITAL_COMMUNITY)
Admission: EM | Admit: 2023-10-05 | Discharge: 2023-10-05 | Disposition: A | Discharge status: Home / Self Care | Payer: Self-pay | Attending: Emergency Medicine | Admitting: Emergency Medicine

## 2023-10-05 DIAGNOSIS — K029 Dental caries, unspecified: Secondary | ICD-10-CM | POA: Insufficient documentation

## 2023-10-05 DIAGNOSIS — K047 Periapical abscess without sinus: Secondary | ICD-10-CM | POA: Insufficient documentation

## 2023-10-05 DIAGNOSIS — R220 Localized swelling, mass and lump, head: Secondary | ICD-10-CM

## 2023-10-05 DIAGNOSIS — K0889 Other specified disorders of teeth and supporting structures: Secondary | ICD-10-CM

## 2023-10-05 DIAGNOSIS — F17210 Nicotine dependence, cigarettes, uncomplicated: Secondary | ICD-10-CM | POA: Insufficient documentation

## 2023-10-05 MED ORDER — AMOXICILLIN-POT CLAVULANATE 875-125 MG PO TABS: 1.0000 | ORAL_TABLET | Freq: Two times a day (BID) | ORAL | 0 refills | Status: DC

## 2023-10-05 MED ORDER — AMOXICILLIN-POT CLAVULANATE 875-125 MG PO TABS: 1.0000 | ORAL_TABLET | Freq: Two times a day (BID) | ORAL | 0 refills | Status: AC

## 2023-10-05 MED ORDER — IBUPROFEN 800 MG PO TABS: 800.0000 mg | ORAL_TABLET | Freq: Three times a day (TID) | ORAL | 0 refills | Status: AC

## 2023-10-05 MED ORDER — OXYCODONE-ACETAMINOPHEN 5-325 MG PO TABS: 1.0000 | ORAL_TABLET | Freq: Four times a day (QID) | ORAL | 0 refills | Status: AC | PRN

## 2023-10-05 MED ORDER — OXYCODONE-ACETAMINOPHEN 5-325 MG PO TABS: 1.0000 | ORAL_TABLET | Freq: Four times a day (QID) | ORAL | 0 refills | Status: DC | PRN

## 2023-10-05 NOTE — ED Triage Notes (Signed)
 Pt states seen and tx'd here for dental abscess on 7/12. States swelling is worse.

## 2023-10-05 NOTE — ED Provider Notes (Signed)
 [facility]    CSN: [id number] Arrival date & time: 10/05/23  1608      History   Chief Complaint Chief Complaint  Patient presents with   Oral Swelling    HPI Tiffany Stephens is a 28 y.o. female.   HPI Patient is a 28 year old female who presents to the urgent care today with concerns of dental pain and facial swelling.  She reports being seen about 10 days ago in the urgent care and was prescribed clindamycin .  She reports no improvement in her symptoms at that time.  She states she was unable to afford a dentist.  She denies getting any resources for low income dentists during her last visit.  She denies any fever, vomiting, difficulty breathing, difficulty swallowing, rash, pus drainage, or other concerns at this time. History reviewed. No pertinent past medical history.  Patient Active Problem List   Diagnosis Date Noted   Menorrhagia 07/29/2012    History reviewed. No pertinent surgical history.  OB History   No obstetric history on file.      Home Medications    Prior to Admission medications   Medication Sig Start Date End Date Taking? Authorizing Provider  amoxicillin -clavulanate (AUGMENTIN ) 875-125 MG tablet Take 1 tablet by mouth every 12 (twelve) hours for 10 days. 10/05/23 10/15/23 Yes Melonie Locus, PA-C  cetirizine  (ZYRTEC  ALLERGY) 10 MG tablet Take 1 tablet (10 mg total) by mouth daily. 10/17/19   Christopher Savannah, PA-C  clindamycin  (CLEOCIN ) 300 MG capsule Take 1 capsule (300 mg total) by mouth 3 (three) times daily. 09/25/23   Rolinda Rogue, MD  ibuprofen  (ADVIL ) 800 MG tablet Take 1 tablet (800 mg total) by mouth 3 (three) times daily with meals. 09/25/23   Rolinda Rogue, MD    Family History Family History  Problem Relation Age of Onset   Diabetes Mother    Heart disease Mother    Healthy Father     Social History Social History   Tobacco Use   Smoking status: Every Day    Current packs/day: 1.00    Types: Cigarettes   Smokeless  tobacco: Never  Vaping Use   Vaping status: Never Used  Substance Use Topics   Alcohol use: Yes   Drug use: No     Allergies   Patient has no known allergies.   Review of Systems Review of Systems See HPI for relevant ROS.  Physical Exam Triage Vital Signs ED Triage Vitals [10/05/23 1704]  Encounter Vitals Group     BP (!) 159/107     Girls Systolic BP Percentile      Girls Diastolic BP Percentile      Boys Systolic BP Percentile      Boys Diastolic BP Percentile      Pulse Rate 74     Resp 16     Temp 98 F (36.7 C)     Temp Source Oral     SpO2 98 %     Weight      Height      Head Circumference      Peak Flow      Pain Score 7     Pain Loc      Pain Education      Exclude from Growth Chart    No data found.  Updated Vital Signs BP (!) 159/107 (BP Location: Right Arm)   Pulse 74   Temp 98 F (36.7 C) (Oral)   Resp 16   LMP 10/01/2023 (Approximate)  SpO2 98%   Visual Acuity Right Eye Distance:   Left Eye Distance:   Bilateral Distance:    Right Eye Near:   Left Eye Near:    Bilateral Near:     Physical Exam General: Alert and oriented, well-developed/well-nourished, calm, cooperative, no acute distress HEENT: Normocephalic atraumatic, moist mucous membranes, no scleral icterus, trachea midline, mild facial edema of the lower left jaw, fracture/erosion of tooth #19 Lungs: Speaking full sentences, non-labored respirations, no distress, clear to auscultation bilaterally Heart: Regular rate and rhythm Abdomen:  Soft, nondistended Musculoskeletal: Moves all extremities well Neurologic: Awake, A&O x4, gait normal Integumentary: Warm, dry, normal for ethnicity, intact, no rash Psychiatric: Appropriate mood & affect  UC Treatments / Results  Labs (all labs ordered are listed, but only abnormal results are displayed) Labs Reviewed - No data to display  EKG   Radiology No results found.  Procedures Procedures (including critical care  time)  Medications Ordered in UC Medications - No data to display  Initial Impression / Assessment and Plan / UC Course  I have reviewed the triage vital signs and the nursing notes.  Pertinent labs & imaging results that were available during my care of the patient were reviewed by me and considered in my medical decision making (see chart for details).    Patient presents with dental pain and facial swelling.  Differential diagnosis includes: Dental cary, dental abscess, tooth fracture, sinusitis, sialoadenitis, peritonsillar abscess, retropharyngeal abscess, Ludwig's angina, including other diagnoses.  History obtained from: Patient.  Plan: Patient presents for dental pain and facial swelling. Patient not immunosuppressed, afebrile and well appearing with patent airway, have low suspicion for deep space infection or any concern for airway compromise.  Patient does have a tooth fracture/erosion in the area of swelling. No evidence of retropharyngeal abscess, peritonsillar abscess, Ludwigs angina. Instructed patient to continue to treat pain with ibuprofen /acetaminophen  until they see a dentist.  Prescribed patient Augmentin  for coverage of possible dental abscess.  Provided patient with information to follow-up with low income dentists.  Recommended that she call these dentist to soon as possible to schedule an appointment. Patient discharged home and will follow up with dentist. Discussed return precautions for odontogenic infections and other dental pain emergencies.   Disposition: Stable to discharge home.   All questions answered to the best of this examiner's ability. Advised to f/u with dentist for further eval and/or reassessment. Patient agrees to plan.  Final Clinical Impressions(s) / UC Diagnoses   Final diagnoses:  Pain, dental  Facial swelling     Discharge Instructions      We have sent in a different antibiotic to try and treat your symptoms.  Please call the  numbers of the dentists on the paper that you were provided today and follow-up with them as soon as possible.  Please go to emergency department if your symptoms worsen, you develop fever, you have any difficulty breathing, difficulty swallowing, or if you have any other concerns.    ED Prescriptions     Medication Sig Dispense Auth. Provider   amoxicillin -clavulanate (AUGMENTIN ) 875-125 MG tablet Take 1 tablet by mouth every 12 (twelve) hours for 10 days. 20 tablet Melonie Locus, PA-C      PDMP not reviewed this encounter.   Melonie Locus, PA-C 10/05/23 [phone]

## 2023-10-05 NOTE — Discharge Instructions (Addendum)
 We evaluated you for your dental pain.  We discussed trying to drain the abscess but you preferred to try antibiotics.  Please take these as prescribed.  It is very important to follow-up with a dentist or your symptoms will not improve.  Your tooth does need to be extracted.  We have attached a list of dentists in the area that you can try to follow-up with.  We have given you a small amount of percocet which you can take for pain not relieved by Motrin .  Do not drink alcohol, drive, or operate machinery when taking this medicine.  Return if you have any new or worsening symptoms such as trouble swallowing, swelling to your face, fevers, or any other new symptoms.

## 2023-10-05 NOTE — Discharge Instructions (Signed)
 We have sent in a different antibiotic to try and treat your symptoms.  Please call the numbers of the dentists on the paper that you were provided today and follow-up with them as soon as possible.  Please go to emergency department if your symptoms worsen, you develop fever, you have any difficulty breathing, difficulty swallowing, or if you have any other concerns.

## 2023-10-05 NOTE — ED Provider Notes (Signed)
 [facility] Provider Note  CSN: [id number] Arrival date & time: 10/05/23 2101  Chief Complaint(s) Dental Pain  HPI Tiffany Stephens is a 28 y.o. female without significant past medical history presenting to the emergency department with dental pain.  Reports having dental pain for around 2 weeks.  Has been on clindamycin .  Today was prescribed other antibiotic at urgent care.  Came to the emergency department for further evaluation.  Denies trouble swallowing, trouble breathing.  Has not seen a dentist and does not have a dental appointment.   Past Medical History History reviewed. No pertinent past medical history. Patient Active Problem List   Diagnosis Date Noted   Menorrhagia 07/29/2012   Home Medication(s) Prior to Admission medications   Medication Sig Start Date End Date Taking? Authorizing Provider  amoxicillin -clavulanate (AUGMENTIN ) 875-125 MG tablet Take 1 tablet by mouth every 12 (twelve) hours for 10 days. 10/05/23 10/15/23  Francesca Elsie CROME, MD  cetirizine  (ZYRTEC  ALLERGY) 10 MG tablet Take 1 tablet (10 mg total) by mouth daily. 10/17/19   Christopher Savannah, PA-C  ibuprofen  (ADVIL ) 800 MG tablet Take 1 tablet (800 mg total) by mouth 3 (three) times daily with meals. 10/05/23   Francesca Elsie CROME, MD  oxyCODONE -acetaminophen  (PERCOCET/ROXICET) 5-325 MG tablet Take 1 tablet by mouth every 6 (six) hours as needed for severe pain (pain score 7-10). 10/05/23   Francesca Elsie CROME, MD                                                                                                                                    Past Surgical History History reviewed. No pertinent surgical history. Family History Family History  Problem Relation Age of Onset   Diabetes Mother    Heart disease Mother    Healthy Father     Social History Social History   Tobacco Use   Smoking status: Every Day    Current packs/day: 1.00    Types: Cigarettes    Smokeless tobacco: Never  Vaping Use   Vaping status: Never Used  Substance Use Topics   Alcohol use: Yes   Drug use: No   Allergies Patient has no known allergies.  Review of Systems Review of Systems  All other systems reviewed and are negative.   Physical Exam Vital Signs  I have reviewed the triage vital signs BP (!) 165/107 (BP Location: Left Arm)   Pulse 64   Temp 97.7 F (36.5 C) (Oral)   Resp 18   Ht 5' 6 (1.676 m)   Wt (!) 147.7 kg   LMP 10/01/2023 (Approximate)   SpO2 99%   BMI 52.56 kg/m  Physical Exam Vitals and nursing note reviewed.  Constitutional:      General: She is not in acute distress.    Appearance: She is well-developed.  HENT:     Head: Normocephalic and atraumatic.     Mouth/Throat:  Mouth: Mucous membranes are moist.     Comments: Significantly diseased left premolar on lower jaw.  Questionable periapical abscess versus local swelling.  Also with possible lymph node in the left submandibular area Eyes:     Pupils: Pupils are equal, round, and reactive to light.  Cardiovascular:     Rate and Rhythm: Normal rate and regular rhythm.     Heart sounds: No murmur heard. Pulmonary:     Effort: Pulmonary effort is normal. No respiratory distress.     Breath sounds: Normal breath sounds.  Abdominal:     General: Abdomen is flat.     Palpations: Abdomen is soft.     Tenderness: There is no abdominal tenderness.  Musculoskeletal:        General: No tenderness.     Right lower leg: No edema.     Left lower leg: No edema.  Skin:    General: Skin is warm and dry.  Neurological:     General: No focal deficit present.     Mental Status: She is alert. Mental status is at baseline.  Psychiatric:        Mood and Affect: Mood normal.        Behavior: Behavior normal.     ED Results and Treatments Labs (all labs ordered are listed, but only abnormal results are displayed) Labs Reviewed - No data to display                                                                                                                         Radiology No results found.  Pertinent labs & imaging results that were available during my care of the patient were reviewed by me and considered in my medical decision making (see MDM for details).  Medications Ordered in ED Medications - No data to display                                                                                                                                   Procedures Procedures  (including critical care time)  Medical Decision Making / ED Course   MDM:  28 year old presenting to the emergency department with dental pain.  Patient is overall well-appearing, on exam she has obviously diseased tooth with possible periapical abscess.  Offered attempted drainage      Additional history obtained: -Additional history obtained from {wsadditionalhistorian:28072} -External records from outside source obtained  and reviewed including: Chart review including previous notes, labs, imaging, consultation notes including ***   Lab Tests: -I ordered, reviewed, and interpreted labs.   The pertinent results include:   Labs Reviewed - No data to display  Notable for ***  EKG   EKG Interpretation Date/Time:    Ventricular Rate:    PR Interval:    QRS Duration:    QT Interval:    QTC Calculation:   R Axis:      Text Interpretation:           Imaging Studies ordered: I ordered imaging studies including *** On my interpretation imaging demonstrates *** I independently visualized and interpreted imaging. I agree with the radiologist interpretation   Medicines ordered and prescription drug management: Meds ordered this encounter  Medications   amoxicillin -clavulanate (AUGMENTIN ) 875-125 MG tablet    Sig: Take 1 tablet by mouth every 12 (twelve) hours for 10 days.    Dispense:  20 tablet    Refill:  0   ibuprofen  (ADVIL ) 800 MG tablet    Sig: Take 1 tablet (800  mg total) by mouth 3 (three) times daily with meals.    Dispense:  21 tablet    Refill:  0   DISCONTD: oxyCODONE -acetaminophen  (PERCOCET/ROXICET) 5-325 MG tablet    Sig: Take 1 tablet by mouth every 6 (six) hours as needed for severe pain (pain score 7-10).    Dispense:  6 tablet    Refill:  0   oxyCODONE -acetaminophen  (PERCOCET/ROXICET) 5-325 MG tablet    Sig: Take 1 tablet by mouth every 6 (six) hours as needed for severe pain (pain score 7-10).    Dispense:  6 tablet    Refill:  0    -I have reviewed the patients home medicines and have made adjustments as needed   Consultations Obtained: I requested consultation with the ***,  and discussed lab and imaging findings as well as pertinent plan - they recommend: ***   Cardiac Monitoring: The patient was maintained on a cardiac monitor.  I personally viewed and interpreted the cardiac monitored which showed an underlying rhythm of: ***  Social Determinants of Health:  Diagnosis or treatment significantly limited by social determinants of health: {wssoc:28071}   Reevaluation: After the interventions noted above, I reevaluated the patient and found that their symptoms have {resolved/improved/worsened:23923::improved}  Co morbidities that complicate the patient evaluation History reviewed. No pertinent past medical history.    Dispostion: Disposition decision including need for hospitalization was considered, and patient {wsdispo:28070::discharged from emergency department.}    Final Clinical Impression(s) / ED Diagnoses Final diagnoses:  Infected dental caries     This chart was dictated using voice recognition software.  Despite best efforts to proofread,  errors can occur which can change the documentation meaning.

## 2023-10-05 NOTE — ED Triage Notes (Signed)
 Patient c/o dental pain x 2 weeks. Patient report taking ibuprofen  without relief. Patient denies fever. Patient denies N/V.

## 2023-11-03 ENCOUNTER — Ambulatory Visit (HOSPITAL_COMMUNITY)
Admission: EM | Admit: 2023-11-03 | Discharge: 2023-11-03 | Disposition: A | Discharge status: Home / Self Care | Payer: Self-pay | Attending: Family Medicine | Admitting: Family Medicine

## 2023-11-03 ENCOUNTER — Encounter (HOSPITAL_COMMUNITY): Payer: Self-pay | Admitting: Emergency Medicine

## 2023-11-03 DIAGNOSIS — K0889 Other specified disorders of teeth and supporting structures: Secondary | ICD-10-CM

## 2023-11-03 MED ORDER — AMOXICILLIN-POT CLAVULANATE 875-125 MG PO TABS: 1.0000 | ORAL_TABLET | Freq: Two times a day (BID) | ORAL | 0 refills | Status: AC

## 2023-11-03 NOTE — ED Triage Notes (Signed)
 Pt c/o left lower dental pain for a couple weeks. Pt reports taken ibuprofen . Finished antibiotics that was on last week.

## 2023-11-06 NOTE — ED Provider Notes (Signed)
  [facility]   [id number] 11/03/23 Arrival Time: 1502  ASSESSMENT & PLAN:  1. Pain, dental    Begin: Meds ordered this encounter  Medications   amoxicillin -clavulanate (AUGMENTIN ) 875-125 MG tablet    Sig: Take 1 tablet by mouth every 12 (twelve) hours.    Dispense:  20 tablet    Refill:  0   OTC as needed for pain. Dental information given; written. To schedule f/u.  Will follow up with PCP or here if worsening or failing to improve as anticipated. Reviewed expectations re: course of current medical issues. Questions answered. Outlined signs and symptoms indicating need for more acute intervention. Patient verbalized understanding. After Visit Summary given.   SUBJECTIVE:  Tiffany Stephens is a 28 y.o. female who presents with left lower dental pain; on/off for couple of weeks but worse past few days. Denies drainage/bleeding. Denies fever. Ibup does help with pain. Tolerating PO intake.   OBJECTIVE: Vitals:   11/03/23 1650  BP: (!) 146/99  Pulse: 65  Resp: 17  Temp: 97.9 F (36.6 C)  TempSrc: Oral  SpO2: 97%    General appearance: alert; no distress HEENT: [place]; AT; fair dentition; lower L rear gum is TTP; without masses/fluctuance Neck: supple with FROM Extremities: no edema; moves all extremities normally Skin: warm and dr Psychological: alert and cooperative; normal mood and affect  No Known Allergies  History reviewed. No pertinent past medical history. Social History   Socioeconomic History   Marital status: Single    Spouse name: Not on file   Number of children: Not on file   Years of education: Not on file   Highest education level: Not on file  Occupational History   Not on file  Tobacco Use   Smoking status: Every Day    Current packs/day: 1.00    Types: Cigarettes   Smokeless tobacco: Never  Vaping Use   Vaping status: Never Used  Substance and Sexual Activity   Alcohol use: Yes   Drug use: No   Sexual activity: Not Currently     Birth control/protection: None  Other Topics Concern   Not on file  Social History Narrative   Not on file   Social Drivers of Health   Financial Resource Strain: Not on file  Food Insecurity: Not on file  Transportation Needs: Not on file  Physical Activity: Not on file  Stress: Not on file  Social Connections: Not on file  Intimate Partner Violence: Not on file   Family History  Problem Relation Age of Onset   Diabetes Mother    Heart disease Mother    Healthy Father    History reviewed. No pertinent surgical history.    Rolinda Rogue, MD 11/06/23 1012

## 2024-01-11 ENCOUNTER — Ambulatory Visit (HOSPITAL_COMMUNITY)
Admission: EM | Admit: 2024-01-11 | Discharge: 2024-01-11 | Disposition: A | Discharge status: Home / Self Care | Payer: Self-pay

## 2024-01-11 ENCOUNTER — Encounter (HOSPITAL_COMMUNITY): Payer: Self-pay | Admitting: *Deleted

## 2024-01-11 DIAGNOSIS — L720 Epidermal cyst: Secondary | ICD-10-CM

## 2024-01-11 NOTE — ED Provider Notes (Signed)
 [facility]    CSN: [id number] Arrival date & time: 01/11/24  1806      History   Chief Complaint Chief Complaint  Patient presents with   Mass    HPI Tiffany Stephens is a 28 y.o. female.   Patient presents today due to 2 months worth of cyst left cheek.  Patient states that initially when she came to the office about 2 months ago she had an abscess about tooth in her left lower quadrant and thought the cyst was an extension of that abscess.  After completing the antibiotics of the abscess the cyst did not go away.  Cyst is not erythematous, fluctuant, or tender to palpation.  The history is provided by the patient.    History reviewed. No pertinent past medical history.  Patient Active Problem List   Diagnosis Date Noted   Menorrhagia 07/29/2012    History reviewed. No pertinent surgical history.  OB History   No obstetric history on file.      Home Medications    Prior to Admission medications   Medication Sig Start Date End Date Taking? Authorizing Provider  amoxicillin -clavulanate (AUGMENTIN ) 875-125 MG tablet Take 1 tablet by mouth every 12 (twelve) hours. 11/03/23   Rolinda Rogue, MD  cetirizine  (ZYRTEC  ALLERGY) 10 MG tablet Take 1 tablet (10 mg total) by mouth daily. 10/17/19   Christopher Savannah, PA-C  ibuprofen  (ADVIL ) 800 MG tablet Take 1 tablet (800 mg total) by mouth 3 (three) times daily with meals. 10/05/23   Francesca Elsie CROME, MD  oxyCODONE -acetaminophen  (PERCOCET/ROXICET) 5-325 MG tablet Take 1 tablet by mouth every 6 (six) hours as needed for severe pain (pain score 7-10). 10/05/23   Francesca Elsie CROME, MD    Family History Family History  Problem Relation Age of Onset   Diabetes Mother    Heart disease Mother    Healthy Father     Social History Social History   Tobacco Use   Smoking status: Every Day    Current packs/day: 1.00    Types: Cigarettes   Smokeless tobacco: Never  Vaping Use   Vaping status: Never Used  Substance Use  Topics   Alcohol use: Yes   Drug use: No     Allergies   Patient has no known allergies.   Review of Systems Review of Systems   Physical Exam Triage Vital Signs ED Triage Vitals  Encounter Vitals Group     BP 01/11/24 1906 (!) 143/96     Girls Systolic BP Percentile --      Girls Diastolic BP Percentile --      Boys Systolic BP Percentile --      Boys Diastolic BP Percentile --      Pulse Rate 01/11/24 1906 65     Resp 01/11/24 1906 18     Temp 01/11/24 1906 98.3 F (36.8 C)     Temp Source 01/11/24 1906 Oral     SpO2 01/11/24 1906 99 %     Weight --      Height --      Head Circumference --      Peak Flow --      Pain Score 01/11/24 1905 0     Pain Loc --      Pain Education --      Exclude from Growth Chart --    No data found.  Updated Vital Signs BP (!) 143/96 (BP Location: Left Arm)   Pulse 65   Temp 98.3  F (36.8 C) (Oral)   Resp 18   LMP 12/13/2023 (Approximate)   SpO2 99%   Visual Acuity Right Eye Distance:   Left Eye Distance:   Bilateral Distance:    Right Eye Near:   Left Eye Near:    Bilateral Near:     Physical Exam Vitals and nursing note reviewed.  Constitutional:      General: She is not in acute distress.    Appearance: Normal appearance. She is not ill-appearing, toxic-appearing or diaphoretic.  HENT:     Head:      Comments: Nontender, raised, nonfluctuant cyst noted of left cheek Eyes:     General: No scleral icterus. Cardiovascular:     Rate and Rhythm: Normal rate and regular rhythm.     Heart sounds: Normal heart sounds.  Pulmonary:     Effort: Pulmonary effort is normal. No respiratory distress.     Breath sounds: Normal breath sounds. No wheezing or rhonchi.  Skin:    General: Skin is warm.  Neurological:     Mental Status: She is alert and oriented to person, place, and time.  Psychiatric:        Mood and Affect: Mood normal.        Behavior: Behavior normal.      UC Treatments / Results  Labs (all labs  ordered are listed, but only abnormal results are displayed) Labs Reviewed - No data to display  EKG   Radiology No results found.  Procedures Procedures (including critical care time)  Medications Ordered in UC Medications - No data to display  Initial Impression / Assessment and Plan / UC Course  I have reviewed the triage vital signs and the nursing notes.  Pertinent labs & imaging results that were available during my care of the patient were reviewed by me and considered in my medical decision making (see chart for details).     Patient was referred to dermatology for potential removal of cyst of face, as I do not feel comfortable with performing this procedure in ED.  Nonerythematous nontender did not see need for antibiotics at this time. Final Clinical Impressions(s) / UC Diagnoses   Final diagnoses:  Epidermal cyst of face   Discharge Instructions   None    ED Prescriptions   None    PDMP not reviewed this encounter.   Andra Corean BROCKS, PA-C 01/11/24 1933

## 2024-01-11 NOTE — ED Triage Notes (Signed)
 Pt states she has had a bump on the left side of her face X 1 month. She has been doing warm compress without any drainage.

## 2024-01-18 ENCOUNTER — Other Ambulatory Visit: Payer: Self-pay

## 2024-01-18 ENCOUNTER — Encounter (HOSPITAL_COMMUNITY): Payer: Self-pay

## 2024-01-18 ENCOUNTER — Emergency Department (HOSPITAL_COMMUNITY)
Admission: EM | Admit: 2024-01-18 | Discharge: 2024-01-18 | Disposition: A | Discharge status: Home / Self Care | Payer: Self-pay | Attending: Emergency Medicine | Admitting: Emergency Medicine

## 2024-01-18 DIAGNOSIS — R220 Localized swelling, mass and lump, head: Secondary | ICD-10-CM | POA: Insufficient documentation

## 2024-01-18 DIAGNOSIS — R229 Localized swelling, mass and lump, unspecified: Secondary | ICD-10-CM

## 2024-01-18 DIAGNOSIS — Z79899 Other long term (current) drug therapy: Secondary | ICD-10-CM | POA: Insufficient documentation

## 2024-01-18 DIAGNOSIS — K0889 Other specified disorders of teeth and supporting structures: Secondary | ICD-10-CM | POA: Insufficient documentation

## 2024-01-18 NOTE — ED Provider Triage Note (Signed)
 Emergency Medicine Provider Triage Evaluation Note  Tiffany Stephens , a 28 y.o. female  was evaluated in triage.  Pt complains of left lower cheek nodule for past mo growing over past 2 weeks. Was evaluated by UC on 01/11/24 who recommended dermatology f/u. Has not reached out to dermatology but has not called for an appointment d/t insurance.  Review of Systems  Positive: See hpi Negative: fevers  Physical Exam  BP (!) 156/105   Pulse 74   Temp 98.6 F (37 C) (Oral)   Resp 18   Ht 5' 6 (1.676 m)   Wt 136.1 kg   LMP 12/13/2023 (Approximate)   SpO2 100%   BMI 48.42 kg/m  Gen:   Awake, no distress   Resp:  Normal effort  MSK:   Moves extremities without difficulty  Other:  No submandibular swelling nor tenderness. No difficulty swallowing. No hoarse voice. Nontender nodule to left lower cheek. No erythema, warmth, streaking  Medical Decision Making  Medically screening exam initiated at 3:51 PM.  Appropriate orders placed.  Tiffany Stephens was informed that the remainder of the evaluation will be completed by another provider, this initial triage assessment does not replace that evaluation, and the importance of remaining in the ED until their evaluation is complete.     Minnie Tinnie BRAVO, PA 01/18/24 1555

## 2024-01-18 NOTE — ED Provider Notes (Signed)
  [place] EMERGENCY DEPARTMENT AT [facility] Provider Note   CSN: [id number] Arrival date & time: 01/18/24  1504     Patient presents with: Abscess   Tiffany Stephens is a 28 y.o. female.  {Add pertinent medical, surgical, social history, OB history to HPI:32947}  Abscess      Prior to Admission medications   Medication Sig Start Date End Date Taking? Authorizing Provider  amoxicillin -clavulanate (AUGMENTIN ) 875-125 MG tablet Take 1 tablet by mouth every 12 (twelve) hours. 11/03/23   Rolinda Rogue, MD  cetirizine  (ZYRTEC  ALLERGY) 10 MG tablet Take 1 tablet (10 mg total) by mouth daily. 10/17/19   Christopher Savannah, PA-C  ibuprofen  (ADVIL ) 800 MG tablet Take 1 tablet (800 mg total) by mouth 3 (three) times daily with meals. 10/05/23   Francesca Elsie CROME, MD  oxyCODONE -acetaminophen  (PERCOCET/ROXICET) 5-325 MG tablet Take 1 tablet by mouth every 6 (six) hours as needed for severe pain (pain score 7-10). 10/05/23   Francesca Elsie CROME, MD    Allergies: Patient has no known allergies.    Review of Systems  Updated Vital Signs BP (!) 156/105   Pulse 74   Temp 98.6 F (37 C) (Oral)   Resp 18   Ht 5' 6 (1.676 m)   Wt 136.1 kg   LMP 12/13/2023 (Approximate)   SpO2 100%   BMI 48.42 kg/m   Physical Exam  (all labs ordered are listed, but only abnormal results are displayed) Labs Reviewed - No data to display  EKG: None  Radiology: No results found.  {Document cardiac monitor, telemetry assessment procedure when appropriate:32947} Procedures   Medications Ordered in the ED - No data to display    {Click here for ABCD2, HEART and other calculators REFRESH Note before signing:1}                              Medical Decision Making  ***  {Document critical care time when appropriate  Document review of labs and clinical decision tools ie [id number], etc  Document your independent review of radiology images and any outside records  Document your discussion  with family members, caretakers and with consultants  Document social determinants of health affecting pt's care  Document your decision making why or why not admission, treatments were needed:32947:::1}   Final diagnoses:  None    ED Discharge Orders     None

## 2024-01-18 NOTE — ED Triage Notes (Signed)
 Pt to er, pt states that she is here for a boil on her face, pt states that she has had it for the past month, no drainage noted.

## 2024-01-18 NOTE — ED Notes (Signed)
 Pt sitting in chair, pt has bump on L face, pt states that she is ready to go home, read and reviewed d/c instructions and follow up, pt verbalized understanding, pt ambulatory from department.

## 2024-01-18 NOTE — Discharge Instructions (Signed)
 Thank you for letting us  evaluate you today.  As discussed, do not feel that I can drain this nodule.  It does not appear infected or hot or tender.  Please follow-up with dermatology for removal.  Return to Emergency Department for experience hoarse voice, difficulty swallowing, significant worsening of size, redness, streaking around the area, severe tenderness, worsening symptoms

## 2024-03-11 ENCOUNTER — Ambulatory Visit (HOSPITAL_COMMUNITY)
Admission: EM | Admit: 2024-03-11 | Discharge: 2024-03-11 | Disposition: A | Discharge status: Home / Self Care | Payer: Self-pay | Attending: Emergency Medicine | Admitting: Emergency Medicine

## 2024-03-11 ENCOUNTER — Encounter (HOSPITAL_COMMUNITY): Payer: Self-pay

## 2024-03-11 DIAGNOSIS — R051 Acute cough: Secondary | ICD-10-CM

## 2024-03-11 DIAGNOSIS — B9789 Other viral agents as the cause of diseases classified elsewhere: Secondary | ICD-10-CM

## 2024-03-11 DIAGNOSIS — J988 Other specified respiratory disorders: Secondary | ICD-10-CM

## 2024-03-11 LAB — POCT INFLUENZA A/B
Influenza A, POC: NEGATIVE
Influenza B, POC: NEGATIVE

## 2024-03-11 LAB — POC SOFIA SARS ANTIGEN FIA: SARS Coronavirus 2 Ag: NEGATIVE

## 2024-03-11 MED ORDER — PROMETHAZINE-DM 6.25-15 MG/5ML PO SYRP: 5.0000 mL | ORAL_SOLUTION | Freq: Every evening | ORAL | 0 refills | Status: AC | PRN

## 2024-03-11 MED ORDER — BENZONATATE 100 MG PO CAPS: 100.0000 mg | ORAL_CAPSULE | Freq: Three times a day (TID) | ORAL | 0 refills | Status: AC

## 2024-03-11 NOTE — Discharge Instructions (Signed)
 Your COVID and flu testing are both negative today.  I believe your symptoms are likely related to a viral respiratory illness. You can take Tessalon  every 8 hours as needed for cough. You can take Promethazine  DM cough syrup at bedtime as needed for cough. I also recommend over-the-counter Mucinex for cough and congestion. Alternate between Tylenol  and ibuprofen  every 6-8 hours as needed for any pain or fever. Make sure you are staying hydrated and getting plenty of rest. Follow-up with your primary care provider or return here as needed.

## 2024-03-11 NOTE — ED Triage Notes (Signed)
 Cough, sweats, fever, and congestion for the last 3-4 days. No known sick exposure.   Patient tried otc day time and night time cold medications with slight relief. Last had meds yesterday.

## 2024-03-11 NOTE — ED Provider Notes (Signed)
 " [facility]    CSN: [id number] Arrival date & time: 03/11/24  1728      History   Chief Complaint Chief Complaint  Patient presents with   Cough    HPI Tiffany Stephens is a 28 y.o. female.   Patient presents with cough, congestion, subjective fever, chills, sweats, and bodyaches that began 4 days ago.  Patient denies chest pain, shortness of breath, nausea, vomiting, diarrhea, and abdominal pain.  Patient denies any known sick exposures. Patient reports that she has been taking Tylenol  and over-the-counter cough and cold medication with minimal relief.  The history is provided by the patient and medical records.  Cough   History reviewed. No pertinent past medical history.  Patient Active Problem List   Diagnosis Date Noted   Menorrhagia 07/29/2012    History reviewed. No pertinent surgical history.  OB History   No obstetric history on file.      Home Medications    Prior to Admission medications  Medication Sig Start Date End Date Taking? Authorizing Provider  benzonatate  (TESSALON ) 100 MG capsule Take 1 capsule (100 mg total) by mouth every 8 (eight) hours. 03/11/24  Yes Johnie Flaming A, NP  promethazine -dextromethorphan (PROMETHAZINE -DM) 6.25-15 MG/5ML syrup Take 5 mLs by mouth at bedtime as needed for cough. 03/11/24  Yes Johnie Flaming A, NP  amoxicillin -clavulanate (AUGMENTIN ) 875-125 MG tablet Take 1 tablet by mouth every 12 (twelve) hours. 11/03/23   Rolinda Rogue, MD  cetirizine  (ZYRTEC  ALLERGY) 10 MG tablet Take 1 tablet (10 mg total) by mouth daily. 10/17/19   Christopher Savannah, PA-C  ibuprofen  (ADVIL ) 800 MG tablet Take 1 tablet (800 mg total) by mouth 3 (three) times daily with meals. 10/05/23   Francesca Elsie CROME, MD  oxyCODONE -acetaminophen  (PERCOCET/ROXICET) 5-325 MG tablet Take 1 tablet by mouth every 6 (six) hours as needed for severe pain (pain score 7-10). 10/05/23   Francesca Elsie CROME, MD    Family History Family History  Problem  Relation Age of Onset   Diabetes Mother    Heart disease Mother    Healthy Father     Social History Social History[1]   Allergies   Patient has no known allergies.   Review of Systems Review of Systems  Respiratory:  Positive for cough.    Per HPI  Physical Exam Triage Vital Signs ED Triage Vitals  Encounter Vitals Group     BP 03/11/24 1925 (!) 148/93     Girls Systolic BP Percentile --      Girls Diastolic BP Percentile --      Boys Systolic BP Percentile --      Boys Diastolic BP Percentile --      Pulse Rate 03/11/24 1925 79     Resp 03/11/24 1925 18     Temp 03/11/24 1925 98.1 F (36.7 C)     Temp Source 03/11/24 1925 Oral     SpO2 03/11/24 1925 100 %     Weight --      Height --      Head Circumference --      Peak Flow --      Pain Score 03/11/24 1924 0     Pain Loc --      Pain Education --      Exclude from Growth Chart --    No data found.  Updated Vital Signs BP (!) 148/93 (BP Location: Right Arm)   Pulse 79   Temp 98.1 F (36.7 C) (Oral)  Resp 18   LMP 02/27/2024 (Approximate)   SpO2 100%   Visual Acuity Right Eye Distance:   Left Eye Distance:   Bilateral Distance:    Right Eye Near:   Left Eye Near:    Bilateral Near:     Physical Exam Vitals and nursing note reviewed.  Constitutional:      General: She is awake. She is not in acute distress.    Appearance: Normal appearance. She is well-developed and well-groomed. She is not ill-appearing.  HENT:     Right Ear: Tympanic membrane, ear canal and external ear normal.     Left Ear: Tympanic membrane, ear canal and external ear normal.     Nose: Congestion and rhinorrhea present.     Mouth/Throat:     Mouth: Mucous membranes are moist.     Pharynx: Posterior oropharyngeal erythema and postnasal drip present. No oropharyngeal exudate.  Cardiovascular:     Rate and Rhythm: Normal rate and regular rhythm.  Pulmonary:     Effort: Pulmonary effort is normal.     Breath sounds:  Normal breath sounds.  Skin:    General: Skin is warm and dry.  Neurological:     Mental Status: She is alert.  Psychiatric:        Behavior: Behavior is cooperative.      UC Treatments / Results  Labs (all labs ordered are listed, but only abnormal results are displayed) Labs Reviewed  POCT INFLUENZA A/B  POC SOFIA SARS ANTIGEN FIA    EKG   Radiology No results found.  Procedures Procedures (including critical care time)  Medications Ordered in UC Medications - No data to display  Initial Impression / Assessment and Plan / UC Course  I have reviewed the triage vital signs and the nursing notes.  Pertinent labs & imaging results that were available during my care of the patient were reviewed by me and considered in my medical decision making (see chart for details).     Patient is overall well-appearing.  Vitals are stable.  COVID and flu testing negative.  Symptoms likely viral in nature.  Prescribed Tessalon  and Promethazine  DM as needed for cough.  Discussed over-the-counter medication for symptoms.  Discussed follow-up and return precautions. Final Clinical Impressions(s) / UC Diagnoses   Final diagnoses:  Acute cough  Viral respiratory illness     Discharge Instructions      Your COVID and flu testing are both negative today.  I believe your symptoms are likely related to a viral respiratory illness. You can take Tessalon  every 8 hours as needed for cough. You can take Promethazine  DM cough syrup at bedtime as needed for cough. I also recommend over-the-counter Mucinex for cough and congestion. Alternate between Tylenol  and ibuprofen  every 6-8 hours as needed for any pain or fever. Make sure you are staying hydrated and getting plenty of rest. Follow-up with your primary care provider or return here as needed.   ED Prescriptions     Medication Sig Dispense Auth. Provider   promethazine -dextromethorphan (PROMETHAZINE -DM) 6.25-15 MG/5ML syrup Take 5  mLs by mouth at bedtime as needed for cough. 118 mL Johnie Flaming A, NP   benzonatate  (TESSALON ) 100 MG capsule Take 1 capsule (100 mg total) by mouth every 8 (eight) hours. 21 capsule Johnie Flaming A, NP      PDMP not reviewed this encounter.    [1]  Social History Tobacco Use   Smoking status: Every Day    Current packs/day: 1.00  Types: Cigarettes   Smokeless tobacco: Never  Vaping Use   Vaping status: Never Used  Substance Use Topics   Alcohol use: Yes   Drug use: No     Johnie Rumaldo LABOR, NP 03/11/24 2050  "

## 2024-03-11 NOTE — ED Notes (Signed)
 Reviewed work note

## 2024-08-30 ENCOUNTER — Ambulatory Visit: Payer: Self-pay | Admitting: Dermatology
